# Patient Record
Sex: Female | Born: 1970
Health system: Southern US, Community
[De-identification: ages and names within clinical notes are randomized; demographics above are authoritative.]

## PROBLEM LIST (undated history)

## (undated) DIAGNOSIS — E119 Type 2 diabetes mellitus without complications: Secondary | ICD-10-CM

## (undated) DIAGNOSIS — Z91018 Allergy to other foods: Secondary | ICD-10-CM

## (undated) HISTORY — DX: Type 2 diabetes mellitus without complications: E11.9

## (undated) HISTORY — PX: MENISCUS REPAIR: SHX5179

---

## 2004-01-31 ENCOUNTER — Emergency Department (HOSPITAL_COMMUNITY): Admission: EM | Admit: 2004-01-31 | Discharge: 2004-01-31 | Payer: Self-pay | Admitting: Emergency Medicine

## 2015-07-13 ENCOUNTER — Emergency Department (HOSPITAL_BASED_OUTPATIENT_CLINIC_OR_DEPARTMENT_OTHER)
Admission: EM | Admit: 2015-07-13 | Discharge: 2015-07-13 | Disposition: A | Discharge status: Home / Self Care | Payer: 59 | Attending: Emergency Medicine | Admitting: Emergency Medicine

## 2015-07-13 ENCOUNTER — Emergency Department (HOSPITAL_BASED_OUTPATIENT_CLINIC_OR_DEPARTMENT_OTHER): Payer: 59

## 2015-07-13 ENCOUNTER — Encounter (HOSPITAL_BASED_OUTPATIENT_CLINIC_OR_DEPARTMENT_OTHER): Payer: Self-pay

## 2015-07-13 DIAGNOSIS — R079 Chest pain, unspecified: Secondary | ICD-10-CM | POA: Diagnosis not present

## 2015-07-13 DIAGNOSIS — R000 Tachycardia, unspecified: Secondary | ICD-10-CM | POA: Diagnosis not present

## 2015-07-13 HISTORY — DX: Allergy to other foods: Z91.018

## 2015-07-13 LAB — TROPONIN I
Troponin I: <0.03 ng/mL
Troponin I: <0.03 ng/mL

## 2015-07-13 LAB — BASIC METABOLIC PANEL WITH GFR
Anion gap: 11 (ref 5–15)
BUN: 10 mg/dL (ref 6–20)
CO2: 22 mmol/L (ref 22–32)
Calcium: 9.2 mg/dL (ref 8.9–10.3)
Chloride: 103 mmol/L (ref 101–111)
Creatinine, Ser: 0.55 mg/dL (ref 0.44–1.00)
GFR calc Af Amer: >60 mL/min
GFR calc non Af Amer: >60 mL/min
Glucose, Bld: 105 mg/dL — ABNORMAL HIGH (ref 65–99)
Potassium: 3.7 mmol/L (ref 3.5–5.1)
Sodium: 136 mmol/L (ref 135–145)

## 2015-07-13 LAB — CBC
HCT: 39 % (ref 36.0–46.0)
Hemoglobin: 13.3 g/dL (ref 12.0–15.0)
MCH: 30 pg (ref 26.0–34.0)
MCHC: 34.1 g/dL (ref 30.0–36.0)
MCV: 88 fL (ref 78.0–100.0)
Platelets: 329 K/uL (ref 150–400)
RBC: 4.43 MIL/uL (ref 3.87–5.11)
RDW: 12.8 % (ref 11.5–15.5)
WBC: 10.9 K/uL — ABNORMAL HIGH (ref 4.0–10.5)

## 2015-07-13 MED ORDER — TRAMADOL HCL 50 MG PO TABS: 50.0000 mg | ORAL_TABLET | Freq: Four times a day (QID) | ORAL | Status: DC | PRN

## 2015-07-13 MED ORDER — HYDROCODONE-ACETAMINOPHEN 5-325 MG PO TABS
1.0000 | ORAL_TABLET | Freq: Once | ORAL | Status: DC
  Filled 2015-07-13: qty 1

## 2015-07-13 MED ORDER — TRAMADOL HCL 50 MG PO TABS
50.0000 mg | ORAL_TABLET | Freq: Once | ORAL | Status: AC
  Administered 2015-07-13: 50 mg via ORAL
  Filled 2015-07-13: qty 1

## 2015-07-13 NOTE — ED Notes (Signed)
CP, left shoulder and upper back pain x 2 weeks-PCP sent pt to ED

## 2015-07-13 NOTE — Discharge Instructions (Signed)
Chest Pain Observation It is often hard to give a specific diagnosis for the cause of chest pain. Among other possibilities your symptoms might be caused by inadequate oxygen delivery to your heart (angina). Angina that is not treated or evaluated can lead to a heart attack (myocardial infarction) or death. Blood tests, electrocardiograms, and X-rays may have been done to help determine a possible cause of your chest pain. After evaluation and observation, your health care provider has determined that it is unlikely your pain was caused by an unstable condition that requires hospitalization. However, a full evaluation of your pain may need to be completed, with additional diagnostic testing as directed. It is very important to keep your follow-up appointments. Not keeping your follow-up appointments could result in permanent heart damage, disability, or death. If there is any problem keeping your follow-up appointments, you must call your health care provider. HOME CARE INSTRUCTIONS  Due to the slight chance that your pain could be angina, it is important to follow your health care provider's treatment plan and also maintain a healthy lifestyle:  Maintain or work toward achieving a healthy weight.  Stay physically active and exercise regularly.  Decrease your salt intake.  Eat a balanced, healthy diet. Talk to a dietitian to learn about heart-healthy foods.  Increase your fiber intake by including whole grains, vegetables, fruits, and nuts in your diet.  Avoid situations that cause stress, anger, or depression.  Take medicines as advised by your health care provider. Report any side effects to your health care provider. Do not stop medicines or adjust the dosages on your own.  Quit smoking. Do not use nicotine patches or gum until you check with your health care provider.  Keep your blood pressure, blood sugar, and cholesterol levels within normal limits.  Limit alcohol intake to no more than  1 drink per day for women who are not pregnant and 2 drinks per day for men.  Do not abuse drugs. SEEK IMMEDIATE MEDICAL CARE IF: You have severe chest pain or pressure which may include symptoms such as:  You feel pain or pressure in your arms, neck, jaw, or back.  You have severe back or abdominal pain, feel sick to your stomach (nauseous), or throw up (vomit).  You are sweating profusely.  You are having a fast or irregular heartbeat.  You feel short of breath while at rest.  You notice increasing shortness of breath during rest, sleep, or with activity.  You have chest pain that does not get better after rest or after taking your usual medicine.  You wake from sleep with chest pain.  You are unable to sleep because you cannot breathe.  You develop a frequent cough or you are coughing up blood.  You feel dizzy, faint, or experience extreme fatigue.  You develop severe weakness, dizziness, fainting, or chills. Any of these symptoms may represent a serious problem that is an emergency. Do not wait to see if the symptoms will go away. Call your local emergency services (911 in the [place] Do not drive yourself to the hospital. MAKE SURE YOU:  Understand these instructions.  Will watch your condition.  Will get help right away if you are not doing well or get worse. Document Released: 11/29/2010 Document Revised: 11/01/2013 Document Reviewed: 04/28/2013 [facility] Patient Information 2015 [place], [place]. This information is not intended to replace advice given to you by your health care provider. Make sure you discuss any questions you have with your health care provider.  Follow up with cardiology, recommend stress test to evaluate source of chest pain. Return if chest pain persists, left arm pain/weakness, shortness of breath occurs.

## 2015-07-13 NOTE — ED Provider Notes (Signed)
CSN: [id number]     Arrival date & time 07/13/15  1634 History   First MD Initiated Contact with Patient 07/13/15 1646     Chief Complaint  Patient presents with  . Chest Pain     (Consider location/radiation/quality/duration/timing/severity/associated sxs/prior Treatment) HPI Comments: Angela Carson is a 44 y.o F with no significant pmhx who presents to the Emergency Department c/o chest pain x 2 weeks. Pt states that she feels a "heaviness" in her left upper chest that radiates to her left should and left upper back. Pt has been experiencing this pain almost every day for 2 weeks most often in the late afternoon when she gets off work. No aggravating or alleviating factors. Pain 7/10. No trauma or injury. Pain does not radiate to her left arm, however, pt states that when she wakes up in the mornings her arm tingles sometimes. Endorses mild SOB in the afternoons.  Pt saw her PCP today who performed an EKG and then sent her to the ER for further evaluation. Pt does not have a history of heart dz and does not take any medications. Never a smoker and does not take exogenous estrogen. Denies DOE, HA, n/v, syncope, lightheadedness, dizziness, numbness, weakness.   Patient is a 44 y.o. female presenting with chest pain. The history is provided by the patient.  Chest Pain Associated symptoms: no diaphoresis, no fever and no palpitations     Past Medical History  Diagnosis Date  . Multiple food allergies    History reviewed. No pertinent past surgical history. No family history on file. Social History  Substance Use Topics  . Smoking status: Never Smoker   . Smokeless tobacco: None  . Alcohol Use: No   OB History    No data available     Review of Systems  Constitutional: Negative for fever, chills and diaphoresis.  Cardiovascular: Positive for chest pain. Negative for palpitations and leg swelling.  Musculoskeletal: Negative for neck pain and neck stiffness.  All other systems  reviewed and are negative.     Allergies  Review of patient's allergies indicates no known allergies.  Home Medications   Prior to Admission medications   Not on File   BP 157/83 mmHg  Pulse 107  Temp(Src) 98.5 F (36.9 C) (Oral)  Resp 15  Ht  (1.549 m)  Wt 159 lb (72.122 kg)  BMI 30.06 kg/m2  SpO2 99%  LMP 06/26/2015 Physical Exam  Constitutional: She is oriented to person, place, and time. She appears well-developed and well-nourished. No distress.  HENT:  Head: Normocephalic and atraumatic.  Mouth/Throat: Oropharynx is clear and moist. No oropharyngeal exudate.  Eyes: Conjunctivae and EOM are normal. Pupils are equal, round, and reactive to light. Right eye exhibits no discharge. Left eye exhibits no discharge. No scleral icterus.  Neck: No JVD present.  Cardiovascular: Regular rhythm, normal heart sounds and intact distal pulses.  Exam reveals no gallop and no friction rub.   No murmur heard. Tachycardic 107 bpm  Pulmonary/Chest: Effort normal and breath sounds normal. No respiratory distress. She has no wheezes. She has no rales. She exhibits tenderness.  Abdominal: Soft. She exhibits no distension. There is no tenderness. There is no rebound and no guarding.  Musculoskeletal: Normal range of motion. She exhibits no edema.  No TTP of left shoulder. No midline tenderness of spine.  Lymphadenopathy:    She has no cervical adenopathy.  Neurological: She is alert and oriented to person, place, and time.  Strength 5/5  throughout. No sensory deficits.    Skin: Skin is warm and dry. No rash noted. She is not diaphoretic. No erythema. No pallor.  Psychiatric: She has a normal mood and affect. Her behavior is normal.  Nursing note and vitals reviewed.   ED Course  Procedures (including critical care time)  CBC, BMP, CXR, troponin ordered Will order 2 troponin  Labs Review Labs Reviewed  BASIC METABOLIC PANEL - Abnormal; Notable for the following:    Glucose,  Bld 105 (*)    All other components within normal limits  CBC - Abnormal; Notable for the following:    WBC 10.9 (*)    All other components within normal limits  TROPONIN I  TROPONIN I    Imaging Review No results found. I have personally reviewed and evaluated these images and lab results as part of my medical decision-making.   EKG Interpretation   Date/Time:  Friday July 13 2015 16:42:34 EDT Ventricular Rate:  98 PR Interval:  166 QRS Duration: 78 QT Interval:  358 QTC Calculation: 457 R Axis:   37 Text Interpretation:  Normal sinus rhythm Normal ECG Similar to prior  Confirmed by Gwendolyn Grant  MD, BLAIR ([id number]) on 07/13/2015 4:43:18 PM      MDM   Final diagnoses:  Chest pain, unspecified chest pain type   Pt seen for chest pain x 2 weeks, sent to the ER by PCP. EKG unremarkable. MI or arrhythmia unlikely. Troponin x2 negative. CXR negative. PERC negative. Recommend follow up with cardiology for follow up stress test. HEART score of 1: low risk for major cardiac event.  Patient was discussed with and seen by Dr. Gwendolyn Grant who agrees with the treatment plan.      Lester Kinsman [place], PA-C 07/13/15 2111  Dub Mikes, PA-C 07/13/15 2113  Elwin Mocha, MD 07/13/15 [phone]

## 2017-08-12 ENCOUNTER — Ambulatory Visit (INDEPENDENT_AMBULATORY_CARE_PROVIDER_SITE_OTHER): Payer: 59 | Admitting: Podiatry

## 2017-08-12 ENCOUNTER — Encounter: Payer: Self-pay | Admitting: Podiatry

## 2017-08-12 VITALS — BP 143/78 | HR 90 | Ht 61.0 in | Wt 150.0 lb

## 2017-08-12 DIAGNOSIS — M722 Plantar fascial fibromatosis: Secondary | ICD-10-CM | POA: Diagnosis not present

## 2017-08-12 DIAGNOSIS — M79672 Pain in left foot: Secondary | ICD-10-CM | POA: Diagnosis not present

## 2017-08-12 DIAGNOSIS — M216X1 Other acquired deformities of right foot: Secondary | ICD-10-CM

## 2017-08-12 DIAGNOSIS — M21969 Unspecified acquired deformity of unspecified lower leg: Secondary | ICD-10-CM | POA: Diagnosis not present

## 2017-08-12 DIAGNOSIS — M79671 Pain in right foot: Secondary | ICD-10-CM | POA: Diagnosis not present

## 2017-08-12 DIAGNOSIS — M216X2 Other acquired deformities of left foot: Secondary | ICD-10-CM

## 2017-08-12 MED ORDER — NABUMETONE 500 MG PO TABS: 500.0000 mg | ORAL_TABLET | Freq: Two times a day (BID) | ORAL | 1 refills | Status: DC

## 2017-08-12 NOTE — Progress Notes (Signed)
SUBJECTIVE: 46 y.o. year old female presents complaining of pain in both feet and leg for over a year. Hurts at heel, arch and ball, all over. Hurt the most in the afternoon, in the morning when getting out of bed. On feet at work about 8 hours working in [employer]. Patient referred herself.  REVIEW OF SYSTEMS: Constitutional: negative for chills, fevers, night sweats and weight loss Ears, nose, mouth, throat, and face: negative Respiratory: negative Cardiovascular: negative Gastrointestinal: negative Musculoskeletal:negative, Been having back and hip pain for a month or two. Neurological: Feels numbness and tingling on feet bottom and back of heel. Endocrine: negative Allergic/Immunologic: negative  OBJECTIVE: DERMATOLOGIC EXAMINATION: No abnormal skin lesions noted.  VASCULAR EXAMINATION OF LOWER LIMBS: All pedal pulses are palpable with normal pulsation.  No edema or erythema noted bilateral. Temperature gradient from tibial crest to dorsum of foot is within normal bilateral.  NEUROLOGIC EXAMINATION OF THE LOWER LIMBS: Achilles DTR is present and within normal. Monofilament (Semmes-Weinstein 10-gm) sensory testing positive 6 out of 6, bilateral. Vibratory sensations(128Hz  turning fork) intact at medial and lateral forefoot bilateral.  Sharp and Dull discriminatory sensations at the plantar ball of hallux is intact bilateral.   MUSCULOSKELETAL EXAMINATION: High arched foot with excess first ray sagittal plane motion bilateral. Positive for excessive subtalar and ankle joint pronation bilateral. Pain at plantar heel and arches of both feet.  RADIOGRAPHIC STUDIES:  AP View:  Metatarsus adductus bilateral. Long first metatarsal bone bilateral. Minimum change in CCJ deviation. Lateral view:  High arched cavus foot with anterior break in CYMA line bilateral. Positive for plantar and posterior heel spur bilateral. Elevated first metatarsal bone  L>R.   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 Plantar fasciitis bilateral.  PLAN: Reviewed findings and available treatment options, ankle brace, orthotics, proper shoe gear, NSAIA, possible surgical option. Left foot and ankle placed in ankle brace with instruction. Relafen 500 mg bid prescribed. As per request surgery consent form reviewed for STJ Arthroereisis left foot. May also benefit for custom orthotics.

## 2017-08-12 NOTE — Patient Instructions (Signed)
Seen for bilateral foot pain.  Noted of excess subtalar joint pronation. Reviewed clinical findings and available treatment options. Ankle brace dispensed with instruction. Need to have custom orthotics. As per request surgery consent form reviewed for Subtalar joint Arthroereisis left foot.

## 2018-01-28 DIAGNOSIS — J069 Acute upper respiratory infection, unspecified: Secondary | ICD-10-CM | POA: Diagnosis not present

## 2018-01-28 DIAGNOSIS — R05 Cough: Secondary | ICD-10-CM | POA: Diagnosis not present

## 2018-08-06 DIAGNOSIS — M5441 Lumbago with sciatica, right side: Secondary | ICD-10-CM | POA: Diagnosis not present

## 2018-08-06 DIAGNOSIS — M5442 Lumbago with sciatica, left side: Secondary | ICD-10-CM | POA: Diagnosis not present

## 2018-08-06 DIAGNOSIS — R293 Abnormal posture: Secondary | ICD-10-CM | POA: Diagnosis not present

## 2018-09-21 NOTE — Progress Notes (Addendum)
[facility] [address] [place], [place] [address] [phone]  Date:  09/23/2018   Name:  Angela Carson   DOB:  1971-09-07   MRN:  [id number]  PCP:  Pearline Cables, MD    Chief Complaint: New Patient (Initial Visit)   History of Present Illness:  Angela Carson is a 47 y.o. 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   He is Tour manager also at [employer]  She does have some foot trouble and sees podiatry  She has to stand a lot at work- however this requirment had eased so her feet are doing better  She is a never smoker She has generally been in good health No operations   In her free time she enjoys cooking, relaxing at home  For exercise she likes to walk when she can  She did have a mammogram several years ago No recent pap either - never had an abnl   We decided to do a pap and CPE today Offered Tdap but she wishes to do later   She is using condoms right now for contraception She had been on depo but stopped using this a few years ago Her menses are still regular  No history of stroke,no history of HTN She does not get migraine or aura No history of cancer We were not easily able to find a contraceptive that she wishes to use, so asked her to come back soon to discuss options in further detail   Patient Active Problem List   Diagnosis Date Noted  . Foot pain, bilateral 08/12/2017    Past Medical History:  Diagnosis Date  . Multiple food allergies     No past surgical history on file.  Social History   Tobacco Use  . Smoking status: Never Smoker  . Smokeless tobacco:  Never Used  Substance Use Topics  . Alcohol use: No  . Drug use: No    No family history on file.  No Known Allergies  Medication list has been reviewed and updated.  Current Outpatient Medications on File Prior to Visit  Medication Sig Dispense Refill  . fexofenadine (ALLEGRA) 180 MG tablet Take by mouth.    Marland Kitchen ibuprofen (ADVIL,MOTRIN) 100 MG tablet Take 100 mg by mouth every 6 (six) hours as needed for fever.    . Melatonin 1 MG CAPS Take by mouth.    . nabumetone (RELAFEN) 500 MG tablet Take 1 tablet (500 mg total) by mouth 2 (two) times daily. 60 tablet 1   No current facility-administered medications on file prior to visit.     Review of Systems:  As per HPI- otherwise negative.   Physical Examination: Vitals:   09/23/18 1023  BP: 132/88  Pulse: 97  Resp: 16  Temp: 97.7 F (36.5 C)  SpO2: 98%   Vitals:   09/23/18 1023  Weight: 157 lb (71.2 kg)  Height: 5\' 1"  (1.549 m)   Body mass index is 29.66 kg/m. Ideal Body Weight: Weight in (lb) to have BMI = 25: 132  GEN:  WDWN, NAD, Non-toxic, A & O x 3, overweight, looks well  HEENT: Atraumatic, Normocephalic. Neck supple. No masses, No LAD. Bilateral TM wnl, oropharynx normal.  PEERL,EOMI.   Ears and Nose: No external deformity. CV: RRR, No M/G/R. No JVD. No thrill. No extra heart sounds. PULM: CTA B, no wheezes, crackles, rhonchi. No retractions. No resp. distress. No accessory muscle use. ABD: S, NT, ND. No rebound. No HSM. EXTR: No c/c/e NEURO Normal gait.  PSYCH: Normally interactive. Conversant. Not depressed or anxious appearing.  Calm demeanor.  Pelvic: normal, no vaginal lesions or discharge. Uterus normal, no CMT, no adnexal tendereness or masses 3 approx 5mm diameter firm, hyperpigmented nodules are noted on her legs  LMP 11/7   Assessment and Plan: Physical exam  Screening for cervical cancer - Plan: Cytology - PAP  Screening for breast cancer - Plan: MM 3D SCREEN BREAST BILATERAL  Screening  for deficiency anemia - Plan: CBC  Screening for diabetes mellitus - Plan: Comprehensive metabolic panel, Hemoglobin A1c  Screening for hyperlipidemia - Plan: Lipid panel  Multiple skin nodules - Plan: Ambulatory referral to Dermatology  Immunization due  CPE today- new patient Labs pending as above Ordered mammo Did pap Asked her to see me soon to discuss other concerns as we ran out of time today   Signed Abbe Amsterdam, MD  Received her labs 11/16  Results for orders placed or performed in visit on 09/23/18  CBC  Result Value Ref Range   WBC 6.2 4.0 - 10.5 K/uL   RBC 4.59 3.87 - 5.11 Mil/uL   Platelets 315.0 150.0 - 400.0 K/uL   Hemoglobin 13.6 12.0 - 15.0 g/dL   HCT 40.9 81.1 - 91.4 %   MCV 88.0 78.0 - 100.0 fl   MCHC 33.7 30.0 - 36.0 g/dL   RDW 78.2 95.6 - 21.3 %  Comprehensive metabolic panel  Result Value Ref Range   Sodium 138 135 - 145 mEq/L   Potassium 3.6 3.5 - 5.1 mEq/L   Chloride 103 96 - 112 mEq/L   CO2 25 19 - 32 mEq/L   Glucose, Bld 89 70 - 99 mg/dL   BUN 8 6 - 23 mg/dL   Creatinine, Ser 0.86 0.40 - 1.20 mg/dL   Total Bilirubin 0.5 0.2 - 1.2 mg/dL   Alkaline Phosphatase 62 39 - 117 U/L   AST 13 0 - 37 U/L   ALT 11 0 - 35 U/L   Total Protein 7.8 6.0 - 8.3 g/dL   Albumin 4.5 3.5 - 5.2 g/dL   Calcium 9.1 8.4 - 57.8 mg/dL   GFR 469.62 >95.28 mL/min  Hemoglobin A1c  Result Value Ref Range   Hgb A1c MFr Bld 6.7 (H) 4.6 - 6.5 %  Lipid panel  Result Value Ref Range   Cholesterol 232 (H) 0 - 200 mg/dL   Triglycerides 413.2 (H) 0.0 - 149.0 mg/dL   HDL 44.01 >02.72 mg/dL   VLDL 53.6 0.0 - 64.4 mg/dL   LDL Cholesterol 034 (H) 0 - 99 mg/dL   Total CHOL/HDL Ratio 4    NonHDL 169.93   Cytology - PAP  Result Value Ref Range   Adequacy      Satisfactory for evaluation  endocervical/transformation zone component PRESENT.   Diagnosis      NEGATIVE FOR INTRAEPITHELIAL LESIONS OR MALIGNANCY.   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  However, we don't diagnose diabetes unless we see a high A1c twice. Your cholesterol is a bit higher than I would like, but given your young age your risk of heart disease is still low.   Please come and see me again in the next fe

## 2018-09-23 ENCOUNTER — Encounter: Payer: Self-pay | Admitting: Family Medicine

## 2018-09-23 ENCOUNTER — Ambulatory Visit (INDEPENDENT_AMBULATORY_CARE_PROVIDER_SITE_OTHER): Payer: 59 | Admitting: Family Medicine

## 2018-09-23 ENCOUNTER — Other Ambulatory Visit (HOSPITAL_COMMUNITY)
Admission: RE | Admit: 2018-09-23 | Discharge: 2018-09-23 | Disposition: A | Discharge status: Home / Self Care | Payer: 59 | Source: Ambulatory Visit | Attending: Family Medicine | Admitting: Family Medicine

## 2018-09-23 ENCOUNTER — Ambulatory Visit (HOSPITAL_BASED_OUTPATIENT_CLINIC_OR_DEPARTMENT_OTHER)
Admission: RE | Admit: 2018-09-23 | Discharge: 2018-09-23 | Disposition: A | Discharge status: Home / Self Care | Payer: 59 | Source: Ambulatory Visit | Attending: Family Medicine | Admitting: Family Medicine

## 2018-09-23 VITALS — BP 132/88 | HR 97 | Temp 97.7°F | Resp 16 | Ht 61.0 in | Wt 157.0 lb

## 2018-09-23 DIAGNOSIS — Z0000 Encounter for general adult medical examination without abnormal findings: Secondary | ICD-10-CM | POA: Diagnosis not present

## 2018-09-23 DIAGNOSIS — Z130 Encounter for screening for diseases of the blood and blood-forming organs and certain disorders involving the immune mechanism: Secondary | ICD-10-CM

## 2018-09-23 DIAGNOSIS — Z1239 Encounter for other screening for malignant neoplasm of breast: Secondary | ICD-10-CM | POA: Diagnosis not present

## 2018-09-23 DIAGNOSIS — Z13220 Encounter for screening for lipoid disorders: Secondary | ICD-10-CM

## 2018-09-23 DIAGNOSIS — Z1231 Encounter for screening mammogram for malignant neoplasm of breast: Secondary | ICD-10-CM | POA: Diagnosis not present

## 2018-09-23 DIAGNOSIS — Z124 Encounter for screening for malignant neoplasm of cervix: Secondary | ICD-10-CM

## 2018-09-23 DIAGNOSIS — R229 Localized swelling, mass and lump, unspecified: Secondary | ICD-10-CM

## 2018-09-23 DIAGNOSIS — Z131 Encounter for screening for diabetes mellitus: Secondary | ICD-10-CM | POA: Diagnosis not present

## 2018-09-23 DIAGNOSIS — Z23 Encounter for immunization: Secondary | ICD-10-CM

## 2018-09-23 LAB — COMPREHENSIVE METABOLIC PANEL
ALBUMIN: 4.5 g/dL (ref 3.5–5.2)
ALT: 11 U/L (ref 0–35)
AST: 13 U/L (ref 0–37)
Alkaline Phosphatase: 62 U/L (ref 39–117)
BILIRUBIN TOTAL: 0.5 mg/dL (ref 0.2–1.2)
BUN: 8 mg/dL (ref 6–23)
CALCIUM: 9.1 mg/dL (ref 8.4–10.5)
CO2: 25 meq/L (ref 19–32)
Chloride: 103 mEq/L (ref 96–112)
Creatinine, Ser: 0.46 mg/dL (ref 0.40–1.20)
GFR: 154.26 mL/min (ref >60.00)
Glucose, Bld: 89 mg/dL (ref 70–99)
Potassium: 3.6 mEq/L (ref 3.5–5.1)
Sodium: 138 mEq/L (ref 135–145)
Total Protein: 7.8 g/dL (ref 6.0–8.3)

## 2018-09-23 LAB — CBC
HCT: 40.4 % (ref 36.0–46.0)
HEMOGLOBIN: 13.6 g/dL (ref 12.0–15.0)
MCHC: 33.7 g/dL (ref 30.0–36.0)
MCV: 88 fl (ref 78.0–100.0)
PLATELETS: 315 10*3/uL (ref 150.0–400.0)
RBC: 4.59 Mil/uL (ref 3.87–5.11)
RDW: 13.3 % (ref 11.5–15.5)
WBC: 6.2 10*3/uL (ref 4.0–10.5)

## 2018-09-23 LAB — LIPID PANEL
CHOLESTEROL: 232 mg/dL — AB (ref 0–200)
HDL: 61.8 mg/dL (ref >39.00)
LDL CALC: 137 mg/dL — AB (ref 0–99)
NonHDL: 169.93
TRIGLYCERIDES: 163 mg/dL — AB (ref 0.0–149.0)
Total CHOL/HDL Ratio: 4
VLDL: 32.6 mg/dL (ref 0.0–40.0)

## 2018-09-23 LAB — HEMOGLOBIN A1C: HEMOGLOBIN A1C: 6.7 % — AB (ref 4.6–6.5)

## 2018-09-23 NOTE — Patient Instructions (Signed)
Good to see you today- I will be in touch with your labs and pap asap We will also refer you to see dermatology about the skin nodules on your legs Please stop by the imaging dept on the ground floor after your labs today for your mammogram!   Health Maintenance, Female Adopting a healthy lifestyle and getting preventive care can go a long way to promote health and wellness. Talk with your health care provider about what schedule of regular examinations is right for you. This is a good chance for you to check in with your provider about disease prevention and staying healthy. In between checkups, there are plenty of things you can do on your own. Experts have done a lot of research about which lifestyle changes and preventive measures are most likely to keep you healthy. Ask your health care provider for more information. Weight and diet Eat a healthy diet  Be sure to include plenty of vegetables, fruits, low-fat dairy products, and lean protein.  Do not eat a lot of foods high in solid fats, added sugars, or salt.  Get regular exercise. This is one of the most important things you can do for your health. ? Most adults should exercise for at least 150 minutes each week. The exercise should increase your heart rate and make you sweat (moderate-intensity exercise). ? Most adults should also do strengthening exercises at least twice a week. This is in addition to the moderate-intensity exercise.  Maintain a healthy weight  Body mass index (BMI) is a measurement that can be used to identify possible weight problems. It estimates body fat based on height and weight. Your health care provider can help determine your BMI and help you achieve or maintain a healthy weight.  For females 70 years of age and older: ? A BMI below 18.5 is considered underweight. ? A BMI of 18.5 to 24.9 is normal. ? A BMI of 25 to 29.9 is considered overweight. ? A BMI of 30 and above is considered obese.  Watch levels  of cholesterol and blood lipids  You should start having your blood tested for lipids and cholesterol at 47 years of age, then have this test every 5 years.  You may need to have your cholesterol levels checked more often if: ? Your lipid or cholesterol levels are high. ? You are older than 47 years of age. ? You are at high risk for heart disease.  Cancer screening Lung Cancer  Lung cancer screening is recommended for adults 10-54 years old who are at high risk for lung cancer because of a history of smoking.  A yearly low-dose CT scan of the lungs is recommended for people who: ? Currently smoke. ? Have quit within the past 15 years. ? Have at least a 30-pack-year history of smoking. A pack year is smoking an average of one pack of cigarettes a day for 1 year.  Yearly screening should continue until it has been 15 years since you quit.  Yearly screening should stop if you develop a health problem that would prevent you from having lung cancer treatment.  Breast Cancer  Practice breast self-awareness. This means understanding how your breasts normally appear and feel.  It also means doing regular breast self-exams. Let your health care provider know about any changes, no matter how small.  If you are in your 20s or 30s, you should have a clinical breast exam (CBE) by a health care provider every 1-3 years as part of a  regular health exam.  If you are 40 or older, have a CBE every year. Also consider having a breast X-ray (mammogram) every year.  If you have a family history of breast cancer, talk to your health care provider about genetic screening.  If you are at high risk for breast cancer, talk to your health care provider about having an MRI and a mammogram every year.  Breast cancer gene (BRCA) assessment is recommended for women who have family members with BRCA-related cancers. BRCA-related cancers include: ? Breast. ? Ovarian. ? Tubal. ? Peritoneal  cancers.  Results of the assessment will determine the need for genetic counseling and BRCA1 and BRCA2 testing.  Cervical Cancer Your health care provider may recommend that you be screened regularly for cancer of the pelvic organs (ovaries, uterus, and vagina). This screening involves a pelvic examination, including checking for microscopic changes to the surface of your cervix (Pap test). You may be encouraged to have this screening done every 3 years, beginning at age 86.  For women ages 51-65, health care providers may recommend pelvic exams and Pap testing every 3 years, or they may recommend the Pap and pelvic exam, combined with testing for human papilloma virus (HPV), every 5 years. Some types of HPV increase your risk of cervical cancer. Testing for HPV may also be done on women of any age with unclear Pap test results.  Other health care providers may not recommend any screening for nonpregnant women who are considered low risk for pelvic cancer and who do not have symptoms. Ask your health care provider if a screening pelvic exam is right for you.  If you have had past treatment for cervical cancer or a condition that could lead to cancer, you need Pap tests and screening for cancer for at least 20 years after your treatment. If Pap tests have been discontinued, your risk factors (such as having a new sexual partner) need to be reassessed to determine if screening should resume. Some women have medical problems that increase the chance of getting cervical cancer. In these cases, your health care provider may recommend more frequent screening and Pap tests.  Colorectal Cancer  This type of cancer can be detected and often prevented.  Routine colorectal cancer screening usually begins at 47 years of age and continues through 47 years of age.  Your health care provider may recommend screening at an earlier age if you have risk factors for colon cancer.  Your health care provider may also  recommend using home test kits to check for hidden blood in the stool.  A small camera at the end of a tube can be used to examine your colon directly (sigmoidoscopy or colonoscopy). This is done to check for the earliest forms of colorectal cancer.  Routine screening usually begins at age 32.  Direct examination of the colon should be repeated every 5-10 years through 47 years of age. However, you may need to be screened more often if early forms of precancerous polyps or small growths are found.  Skin Cancer  Check your skin from head to toe regularly.  Tell your health care provider about any new moles or changes in moles, especially if there is a change in a mole's shape or color.  Also tell your health care provider if you have a mole that is larger than the size of a pencil eraser.  Always use sunscreen. Apply sunscreen liberally and repeatedly throughout the day.  Protect yourself by wearing long sleeves, pants,  a wide-brimmed hat, and sunglasses whenever you are outside.  Heart disease, diabetes, and high blood pressure  High blood pressure causes heart disease and increases the risk of stroke. High blood pressure is more likely to develop in: ? People who have blood pressure in the high end of the normal range (130-139/85-89 mm Hg). ? People who are overweight or obese. ? People who are African American.  If you are 28-66 years of age, have your blood pressure checked every 3-5 years. If you are 56 years of age or older, have your blood pressure checked every year. You should have your blood pressure measured twice-once when you are at a hospital or clinic, and once when you are not at a hospital or clinic. Record the average of the two measurements. To check your blood pressure when you are not at a hospital or clinic, you can use: ? An automated blood pressure machine at a pharmacy. ? A home blood pressure monitor.  If you are between 50 years and 73 years old, ask your  health care provider if you should take aspirin to prevent strokes.  Have regular diabetes screenings. This involves taking a blood sample to check your fasting blood sugar level. ? If you are at a normal weight and have a low risk for diabetes, have this test once every three years after 47 years of age. ? If you are overweight and have a high risk for diabetes, consider being tested at a younger age or more often. Preventing infection Hepatitis B  If you have a higher risk for hepatitis B, you should be screened for this virus. You are considered at high risk for hepatitis B if: ? You were born in a country where hepatitis B is common. Ask your health care provider which countries are considered high risk. ? Your parents were born in a high-risk country, and you have not been immunized against hepatitis B (hepatitis B vaccine). ? You have HIV or AIDS. ? You use needles to inject street drugs. ? You live with someone who has hepatitis B. ? You have had sex with someone who has hepatitis B. ? You get hemodialysis treatment. ? You take certain medicines for conditions, including cancer, organ transplantation, and autoimmune conditions.  Hepatitis C  Blood testing is recommended for: ? Everyone born from [birth year] through 1965. ? Anyone with known risk factors for hepatitis C.  Sexually transmitted infections (STIs)  You should be screened for sexually transmitted infections (STIs) including gonorrhea and chlamydia if: ? You are sexually active and are younger than 47 years of age. ? You are older than 47 years of age and your health care provider tells you that you are at risk for this type of infection. ? Your sexual activity has changed since you were last screened and you are at an increased risk for chlamydia or gonorrhea. Ask your health care provider if you are at risk.  If you do not have HIV, but are at risk, it may be recommended that you take a prescription medicine daily to  prevent HIV infection. This is called pre-exposure prophylaxis (PrEP). You are considered at risk if: ? You are sexually active and do not regularly use condoms or know the HIV status of your partner(s). ? You take drugs by injection. ? You are sexually active with a partner who has HIV.  Talk with your health care provider about whether you are at high risk of being infected with HIV. If you choose  to begin PrEP, you should first be tested for HIV. You should then be tested every 3 months for as long as you are taking PrEP. Pregnancy  If you are premenopausal and you may become pregnant, ask your health care provider about preconception counseling.  If you may become pregnant, take 400 to 800 micrograms (mcg) of folic acid every day.  If you want to prevent pregnancy, talk to your health care provider about birth control (contraception). Osteoporosis and menopause  Osteoporosis is a disease in which the bones lose minerals and strength with aging. This can result in serious bone fractures. Your risk for osteoporosis can be identified using a bone density scan.  If you are 76 years of age or older, or if you are at risk for osteoporosis and fractures, ask your health care provider if you should be screened.  Ask your health care provider whether you should take a calcium or vitamin D supplement to lower your risk for osteoporosis.  Menopause may have certain physical symptoms and risks.  Hormone replacement therapy may reduce some of these symptoms and risks. Talk to your health care provider about whether hormone replacement therapy is right for you. Follow these instructions at home:  Schedule regular health, dental, and eye exams.  Stay current with your immunizations.  Do not use any tobacco products including cigarettes, chewing tobacco, or electronic cigarettes.  If you are pregnant, do not drink alcohol.  If you are breastfeeding, limit how much and how often you drink  alcohol.  Limit alcohol intake to no more than 1 drink per day for nonpregnant women. One drink equals 12 ounces of beer, 5 ounces of wine, or 1 ounces of hard liquor.  Do not use street drugs.  Do not share needles.  Ask your health care provider for help if you need support or information about quitting drugs.  Tell your health care provider if you often feel depressed.  Tell your health care provider if you have ever been abused or do not feel safe at home. This information is not intended to replace advice given to you by your health care provider. Make sure you discuss any questions you have with your health care provider. Document Released: 05/12/2011 Document Revised: 04/03/2016 Document Reviewed: 07/31/2015 Elsevier Interactive Patient Education  [employer].

## 2018-09-24 LAB — CYTOLOGY - PAP
Diagnosis: NEGATIVE
HPV: NOT DETECTED

## 2019-06-20 ENCOUNTER — Encounter: Payer: Self-pay | Admitting: Medical

## 2019-06-20 ENCOUNTER — Other Ambulatory Visit: Payer: Self-pay

## 2019-06-20 ENCOUNTER — Ambulatory Visit: Payer: Self-pay | Admitting: *Deleted

## 2019-06-20 ENCOUNTER — Ambulatory Visit (INDEPENDENT_AMBULATORY_CARE_PROVIDER_SITE_OTHER): Payer: 59 | Admitting: Medical

## 2019-06-20 ENCOUNTER — Ambulatory Visit (HOSPITAL_BASED_OUTPATIENT_CLINIC_OR_DEPARTMENT_OTHER)
Admission: RE | Admit: 2019-06-20 | Discharge: 2019-06-20 | Disposition: A | Discharge status: Home / Self Care | Payer: 59 | Source: Ambulatory Visit | Attending: Medical | Admitting: Medical

## 2019-06-20 VITALS — BP 143/82 | HR 96 | Temp 98.5°F | Resp 16 | Ht 61.0 in | Wt 161.4 lb

## 2019-06-20 DIAGNOSIS — M79662 Pain in left lower leg: Secondary | ICD-10-CM | POA: Insufficient documentation

## 2019-06-20 MED ORDER — MELOXICAM 7.5 MG PO TABS: 7.5000 mg | ORAL_TABLET | Freq: Every day | ORAL | 0 refills | Status: DC

## 2019-06-20 NOTE — Progress Notes (Signed)
Subjective:    Patient ID: Angela Carson, female    DOB: 09/27/1971, 48 y.o.   MRN: [id number]  HPI  Pt in for left calf swelling for about 2 weeks. Pt state she has standing and sitting for hours sewing mask a whole day before. Rested leg and still has pain. Pt speculates maybe sob but not sure since wearing a mask.  No thigh or groin pain reported.  No trauma or recent new exercise. Non smoker.  LMP- Every other month pattern for 6 months. June 20 th. Pt getting hot flashes already.  Discomfort about level 7/10.   Review of Systems  Constitutional: Negative for chills and fever.  Respiratory: Negative for cough, chest tightness, shortness of breath and wheezing.   Cardiovascular: Negative for chest pain and palpitations.  Gastrointestinal: Negative for abdominal pain.  Musculoskeletal: Negative for back pain and gait problem.  Skin: Negative for rash.  Neurological: Negative for dizziness, seizures, speech difficulty, weakness, light-headedness and numbness.  Hematological: Negative for adenopathy. Does not bruise/bleed easily.  Psychiatric/Behavioral: Negative for behavioral problems, confusion, self-injury and suicidal ideas. The patient is not nervous/anxious.     Past Medical History:  Diagnosis Date  . Multiple food allergies      Social History   Socioeconomic History  . Marital status: Married    Spouse name: Not on file  . Number of children: Not on file  . Years of education: Not on file  . Highest education level: Not on file  Occupational History  . Not on file  Social Needs  . Financial resource strain: Not on file  . Food insecurity    Worry: Not on file    Inability: Not on file  . Transportation needs    Medical: Not on file    Non-medical: Not on file  Tobacco Use  . Smoking status: Never Smoker  . Smokeless tobacco: Never Used  Substance and Sexual Activity  . Alcohol use: No  . Drug use: No  . Sexual activity: Yes    Birth  control/protection: None  Lifestyle  . Physical activity    Days per week: Not on file    Minutes per session: Not on file  . Stress: Not on file  Relationships  . Social Musicianconnections    Talks on phone: Not on file    Gets together: Not on file    Attends religious service: Not on file    Active member of club or organization: Not on file    Attends meetings of clubs or organizations: Not on file    Relationship status: Not on file  . Intimate partner violence    Fear of current or ex partner: Not on file    Emotionally abused: Not on file    Physically abused: Not on file    Forced sexual activity: Not on file  Other Topics Concern  . Not on file  Social History Narrative  . Not on file    No past surgical history on file.  No family history on file.  No Known Allergies  Current Outpatient Medications on File Prior to Visit  Medication Sig Dispense Refill  . fexofenadine (ALLEGRA) 180 MG tablet Take by mouth.    Marland Kitchen. ibuprofen (ADVIL,MOTRIN) 100 MG tablet Take 100 mg by mouth every 6 (six) hours as needed for fever.    . Melatonin 1 MG CAPS Take by mouth.    . nabumetone (RELAFEN) 500 MG tablet Take 1 tablet (500 mg total)  by mouth 2 (two) times daily. 60 tablet 1   No current facility-administered medications on file prior to visit.     BP (!) 143/82   Pulse 96   Temp 98.5 F (36.9 C) (Oral)   Resp 16   Ht 5\' 1"  (1.549 m)   Wt 161 lb 6.4 oz (73.2 kg)   SpO2 99%   BMI 30.50 kg/m       Objective:   Physical Exam  General Mental Status- Alert. General Appearance- Not in acute distress.   Skin General: Color- Normal Color. Moisture- Normal Moisture.  Neck Carotid Arteries- Normal color. Moisture- Normal Moisture. No carotid bruits. No JVD.  Chest and Lung Exam Auscultation: Breath Sounds:-Normal.  Cardiovascular Auscultation:Rythm- Regular. Murmurs & Other Heart Sounds:Auscultation of the heart reveals- No Murmurs.  Abdomen Inspection:-Inspeection  Normal. Palpation/Percussion:Note:No mass. Palpation and Percussion of the abdomen reveal- Non Tender, Non Distended + BS, no rebound or guarding.    Neurologic Cranial Nerve exam:- CN III-XII intact(No nystagmus), symmetric smile. Strength:- 5/5 equal and symmetric strength both upper and lower extremities.   Left lower ext- faint calf  mid swelling. No varicosities. Normal pulses. Good capillary refill.     Assessment & Plan:  [phone].(tried to call but no answer)   For left lower minimal swelling and moderate pain described, will get left lower extremity ultrasound today stat.  Patient gave me her cell phone number and will call her with results when those are back.  We will follow result and if no DVT then likely prescribe meloxicam.  If they persist then might refer to orthopedist.  If no findings on ultrasound then advised conservative measures such as Ace wrap and rest.   [place] later came back showing no DVT. Will ask pt to follow  Friday morning after trying ace wrap compression mid calf and meloxicam.   If pain not improved then consider referral. If swelling or further up leg then consider imaging studies to evaluate possible iliac area as radiologist did mention further imaging on report. See impression note on [place].  Not pt o2 sat 99%. Her reported possible short of likely anxiety related. She states nervous just to be here.  Mackie Pai, PA-C

## 2019-06-20 NOTE — Patient Instructions (Addendum)
For left lower minimal swelling and moderate pain described, will get left lower extremity ultrasound today stat.  Patient gave me her cell phone number and will call her with results when those are back.  We will follow result and if no DVT then likely prescribe meloxicam.  If they persist then might refer to orthopedist.  If no findings on ultrasound then advised conservative measures such as Ace wrap and rest.  [place] later came back showing no DVT. Will ask pt to follow  Friday morning after trying ace wrap compression mid calf and meloxicam.   If pain not improved then consider referral. If swelling or further up leg then consider imaging studies to evaluate possible iliac area as radiologist did mention further imaging on report. See impression note on [place].

## 2019-06-20 NOTE — Telephone Encounter (Signed)
bilaterally below the knee edema with left greater than the right. Stated pain in the left calf is constant. Edema and pain in the left leg has increased recently. Denies CP/SOB. No redness/warmth to legs/left calf. Denies cool /blue foot. Advil helps somewhat with the painWarm transferred for immediate appointment. Reason for Disposition . [1] Thigh, calf, or ankle swelling AND [2] bilateral AND [3] 1 side is more swollen  Answer Assessment - Initial Assessment Questions 1. ONSET: "When did the swelling start?" (e.g., minutes, hours, days)     2-3 weeks ago first noticed 2. LOCATION: "What part of the leg is swollen?"  "Are both legs swollen or just one leg?"     Both legs, left greater than right 3. SEVERITY: "How bad is the swelling?" (e.g., localized; mild, moderate, severe)  - Localized - small area of swelling localized to one leg  - MILD pedal edema - swelling limited to foot and ankle, pitting edema < 1/4 inch (6 mm) deep, rest and elevation eliminate most or all swelling  - MODERATE edema - swelling of lower leg to knee, pitting edema > 1/4 inch (6 mm) deep, rest and elevation only partially reduce swelling  - SEVERE edema - swelling extends above knee, facial or hand swelling present      moderate 4. REDNESS: "Does the swelling look red or infected?"     no 5. PAIN: "Is the swelling painful to touch?" If so, ask: "How painful is it?"   (Scale 1-10; mild, moderate or severe)     Not really 6. FEVER: "Do you have a fever?" If so, ask: "What is it, how was it measured, and when did it start?"      no 7. CAUSE: "What do you think is causing the leg swelling?"     Stands alot 8. MEDICAL HISTORY: "Do you have a history of heart failure, kidney disease, liver failure, or cancer?"     no 9. RECURRENT SYMPTOM: "Have you had leg swelling before?" If so, ask: "When was the last time?" "What happened that time?"    Years ago when she stood on her feet all day 10. OTHER SYMPTOMS: "Do you have  any other symptoms?" (e.g., chest pain, difficulty breathing)       Shortness of breath with activity 11. PREGNANCY: "Is there any chance you are pregnant?" "When was your last menstrual period?"       no  Protocols used: LEG SWELLING AND EDEMA-A-AH

## 2019-06-20 NOTE — Telephone Encounter (Signed)
Appt scheduled

## 2019-06-23 ENCOUNTER — Telehealth: Payer: Self-pay | Admitting: Family Medicine

## 2019-06-23 ENCOUNTER — Encounter: Payer: Self-pay | Admitting: Family Medicine

## 2019-06-23 MED ORDER — DICLOFENAC SODIUM 75 MG PO TBEC: 75.0000 mg | DELAYED_RELEASE_TABLET | Freq: Two times a day (BID) | ORAL | 0 refills | Status: DC

## 2019-06-23 NOTE — Telephone Encounter (Signed)
Pt states she believes the meloxicam (MOBIC) 7.5 MG tablet is giving her stomach cramps, and she does not like it. Pt requesting to change to diclofenac 75 mg.  Pt states Percell Miller was the dr who prescribed.  [id number] [address], [place] [phone] (Phone) [phone] (Fax)

## 2019-06-23 NOTE — Telephone Encounter (Signed)
Please advise 

## 2019-06-23 NOTE — Telephone Encounter (Signed)
ok 

## 2019-06-27 ENCOUNTER — Other Ambulatory Visit: Payer: Self-pay

## 2019-06-27 ENCOUNTER — Ambulatory Visit (INDEPENDENT_AMBULATORY_CARE_PROVIDER_SITE_OTHER): Payer: 59 | Admitting: Medical

## 2019-06-27 DIAGNOSIS — R109 Unspecified abdominal pain: Secondary | ICD-10-CM | POA: Diagnosis not present

## 2019-06-27 DIAGNOSIS — M79669 Pain in unspecified lower leg: Secondary | ICD-10-CM | POA: Diagnosis not present

## 2019-06-27 NOTE — Progress Notes (Signed)
Subjective:    Patient ID: Angela Carson, female    DOB: 08/17/1971, 48 y.o.   MRN: [id number]  HPI  Virtual Visit via Telephone Note  I connected with Angela Carson on 06/27/19 at 11:20 AM EDT by telephone and verified that I am speaking with the correct person using two identifiers.  Location: Patient: home Provider: office   I discussed the limitations, risks, security and privacy concerns of performing an evaluation and management service by telephone and the availability of in person appointments. I also discussed with the patient that there may be a patient responsible charge related to this service. The patient expressed understanding and agreed to proceed.   History of Present Illness:   Pt states new complaint of some abdomen pain. Pt was taking meloxicam  for some mild calf discomfort. Her ultrasound of calf was negative. She states vague discomfort feel better. Some relief with massaging. Calf is no longer swollen. No popliteal pain. No sob or wheezing.  Pt states has not tried diclofenac which Dr. Patsy Lageropland.  Pt states will try ace wrap.  Pt not reporting and dyspnea.    Observations/Objective:  General- no acute distress, pleasant, oriented. Normal speech.   Assessment and Plan: Your previous calf discomfort has improved a lot as well as with decreased swelling.  The ultrasound of lower extremity did not reveal DVT.  Would recommend that you use Ace wrap to calf area, elevate and use diclofenac.  Discontinue meloxicam and let us know if you have same abdomen discomfort with diclofenac.  Please update me by this coming Friday you have any residual lower extremity symptoms.  If so then we would refer you to sports medicine.  Follow-up as regular scheduled with your PCP or with me as needed.  Esperanza RichtersEdward Lynnzie Blackson, PA-C  Follow Up Instructions:    I discussed the assessment and treatment plan with the patient. The patient was provided an opportunity to ask  questions and all were answered. The patient agreed with the plan and demonstrated an understanding of the instructions.   The patient was advised to call back or seek an in-person evaluation if the symptoms worsen or if the condition fails to improve as anticipated.  I provided 15 minutes of non-face-to-face time during this encounter.   Esperanza RichtersEdward Mylah Baynes, PA-C    Review of Systems  Constitutional: Negative for chills, fatigue and fever.  Respiratory: Negative for cough, chest tightness, shortness of breath and wheezing.   Cardiovascular: Negative for chest pain and palpitations.  Musculoskeletal:       See hpi.  Neurological: Negative for dizziness and light-headedness.  Hematological: Negative for adenopathy. Does not bruise/bleed easily.  Psychiatric/Behavioral: Negative for behavioral problems and confusion.   Past Medical History:  Diagnosis Date  . Multiple food allergies      Social History   Socioeconomic History  . Marital status: Married    Spouse name: Not on file  . Number of children: Not on file  . Years of education: Not on file  . Highest education level: Not on file  Occupational History  . Not on file  Social Needs  . Financial resource strain: Not on file  . Food insecurity    Worry: Not on file    Inability: Not on file  . Transportation needs    Medical: Not on file    Non-medical: Not on file  Tobacco Use  . Smoking status: Never Smoker  . Smokeless tobacco: Never Used  Substance and Sexual  Activity  . Alcohol use: No  . Drug use: No  . Sexual activity: Yes    Birth control/protection: None  Lifestyle  . Physical activity    Days per week: Not on file    Minutes per session: Not on file  . Stress: Not on file  Relationships  . Social Herbalist on phone: Not on file    Gets together: Not on file    Attends religious service: Not on file    Active member of club or organization: Not on file    Attends meetings of clubs or  organizations: Not on file    Relationship status: Not on file  . Intimate partner violence    Fear of current or ex partner: Not on file    Emotionally abused: Not on file    Physically abused: Not on file    Forced sexual activity: Not on file  Other Topics Concern  . Not on file  Social History Narrative  . Not on file    No past surgical history on file.  No family history on file.  No Known Allergies  Current Outpatient Medications on File Prior to Visit  Medication Sig Dispense Refill  . diclofenac (VOLTAREN) 75 MG EC tablet Take 1 tablet (75 mg total) by mouth 2 (two) times daily. Use as needed for pain 30 tablet 0  . fexofenadine (ALLEGRA) 180 MG tablet Take by mouth.    Marland Kitchen ibuprofen (ADVIL,MOTRIN) 100 MG tablet Take 100 mg by mouth every 6 (six) hours as needed for fever.    . Melatonin 1 MG CAPS Take by mouth.    . nabumetone (RELAFEN) 500 MG tablet Take 1 tablet (500 mg total) by mouth 2 (two) times daily. 60 tablet 1   No current facility-administered medications on file prior to visit.     There were no vitals taken for this visit.      Objective:   Physical Exam        Assessment & Plan:

## 2019-06-27 NOTE — Patient Instructions (Signed)
Your previous calf discomfort has improved a lot as well as with decreased swelling.  The ultrasound of lower extremity did not reveal DVT.  Would recommend that you use Ace wrap to calf area, elevate and use diclofenac.  Discontinue meloxicam and let us know if you have same abdomen discomfort with diclofenac.  Please update me by this coming Friday you have any residual lower extremity symptoms.  If so then we would refer you to sports medicine.  Follow-up as regular scheduled with your PCP or with me as needed.

## 2019-07-12 ENCOUNTER — Other Ambulatory Visit: Payer: Self-pay | Admitting: Medical

## 2019-07-27 ENCOUNTER — Other Ambulatory Visit: Payer: Self-pay

## 2019-07-27 MED ORDER — DICLOFENAC SODIUM 75 MG PO TBEC: 75.0000 mg | DELAYED_RELEASE_TABLET | Freq: Two times a day (BID) | ORAL | 3 refills | Status: DC

## 2019-07-28 ENCOUNTER — Ambulatory Visit (INDEPENDENT_AMBULATORY_CARE_PROVIDER_SITE_OTHER): Payer: 59

## 2019-07-28 ENCOUNTER — Other Ambulatory Visit: Payer: Self-pay

## 2019-07-28 DIAGNOSIS — Z23 Encounter for immunization: Secondary | ICD-10-CM | POA: Diagnosis not present

## 2019-07-28 NOTE — Progress Notes (Signed)
Flu shot given w/o any complications 

## 2019-08-13 NOTE — Progress Notes (Addendum)
[name] [address], [address] [place], [place] [address] [phone] [phone]  Date:  08/17/2019   Name:  Angela Carson   DOB:  04/23/1971   MRN:  [id number]  PCP:  Pearline Cablesopland, Jerzie Bieri C, MD    Chief Complaint: Annual Exam   History of Present Illness:  Angela Carson is a 48 y.o. very pleasant female patient who presents with the following:  Generally healthy young woman here today for physical exam Last seen by myself November 2019.  She has seen Ramon DredgeEdward a couple of times recently for a calf pain- she thinks she hurt herself doing a home-sewn mask project for the pandemic  She works in a stock room, is married with 2 sons.  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   She has issues with her feet, does see podiatry Never a smoker  Flu shot already done Pap last year Tdap-due, give today  Mammogram 1 year ago- will do next month  Labs 1 year ago- she is not fasting  She is not exercising as much as she would like Otherwise she has no concerns, is feeling well   Her older son and husband are coming to see me next week- she would like her younger son to be seen as well which is fine   LMP last week Wt Readings from Last 3 Encounters:  08/17/19 160 lb (72.6 kg)  06/20/19 161 lb 6.4 oz (73.2 kg)  09/23/18 157 lb (71.2 kg)     Patient Active Problem List   Diagnosis Date Noted  . Foot pain, bilateral 08/12/2017    Past Medical History:  Diagnosis Date  . Multiple food allergies     No past surgical history on file.  Social History   Tobacco Use  . Smoking status: Never Smoker  . Smokeless tobacco: Never Used  Substance Use Topics  . Alcohol use: No  . Drug use: No    No family history on file.  No Known Allergies  Medication list has been reviewed and updated.  Current Outpatient Medications on File Prior to Visit  Medication Sig Dispense Refill  . diclofenac  (VOLTAREN) 75 MG EC tablet Take 1 tablet (75 mg total) by mouth 2 (two) times daily. Use as needed for pain 60 tablet 3  . fexofenadine (ALLEGRA) 180 MG tablet Take by mouth.    Marland Kitchen. ibuprofen (ADVIL,MOTRIN) 100 MG tablet Take 100 mg by mouth every 6 (six) hours as needed for fever.     No current facility-administered medications on file prior to visit.     Review of Systems:  As per HPI- otherwise negative.  Overweight, looks well  Physical Examination: Vitals:   08/17/19 1352  BP: 132/80  Pulse: 92  Resp: 16  Temp: 97.8 F (36.6 C)  SpO2: 98%   Vitals:   08/17/19 1352  Weight: 160 lb (72.6 kg)  Height: 5\' 1"  (1.549 m)   Body mass index is 30.23 kg/m. Ideal Body Weight: Weight in (lb) to have BMI = 25: 132  GEN: WDWN, NAD, Non-toxic, A & O x 3, overweight, looks well  HEENT: Atraumatic, Normocephalic. Neck supple. No masses, No LAD.  Bilateral TM wnl, oropharynx normal.  PEERL,EOMI.   Ears and Nose: No external deformity. CV: RRR, No M/G/R. No JVD. No thrill. No extra heart sounds. PULM: CTA B, no wheezes, crackles, rhonchi. No retractions. No resp. distress.  No accessory muscle use. ABD: S, NT, ND, +BS. No rebound. No HSM. EXTR: No c/c/e NEURO Normal gait.  PSYCH: Normally interactive. Conversant. Not depressed or anxious appearing.  Calm demeanor.    Assessment and Plan: Physical exam  Screening for deficiency anemia - Plan: CBC  Screening for diabetes mellitus - Plan: Comprehensive metabolic panel, Hemoglobin A1c  Screening for hyperlipidemia - Plan: Lipid panel  Screening for thyroid disorder - Plan: TSH  Screening for HIV (human immunodeficiency virus) - Plan: HIV Antibody (routine testing w rflx)  Immunization due - Plan: Tdap vaccine greater than or equal to 7yo IM  CPE today Labs pending as above mammo due next month tdap given Pap uTD Will plan further follow- up pending labs.   Signed Lamar Blinks, MD  Received labs as below,  10/8 Message to patient-diagnosed with diabetes, second A1c greater than 6.5%   Your blood counts are normal Metabolic profile is normal except for high blood sugar HIV is negative, thyroid is normal  I am afraid that your A1c, average blood sugar the previous 3 months, is once again greater than 6.5%.  This means that you do have diabetes.  I am going to send an oral medication for you to take to lower your blood sugar, called metformin.  Take it once a day for a week, and then increase to twice a day.  This may cause some loose stools and gas which typically gets better with time  Please work on increased exercise and try to lose a few pounds.  I would also suggest eating fewer carbs and sweets, more vegetables and protein  Your cholesterol is also high, but this may improve as we get your blood sugar under control  Let me know if any questions, otherwise please see me in 3 months for follow-up   Results for orders placed or performed in visit on 08/17/19  CBC  Result Value Ref Range   WBC 7.6 4.0 - 10.5 K/uL   RBC 4.40 3.87 - 5.11 Mil/uL   Platelets 321.0 150.0 - 400.0 K/uL   Hemoglobin 13.0 12.0 - 15.0 g/dL   HCT 38.9 36.0 - 46.0 %   MCV 88.3 78.0 - 100.0 fl   MCHC 33.4 30.0 - 36.0 g/dL   RDW 13.2 11.5 - 15.5 %  Comprehensive metabolic panel  Result Value Ref Range   Sodium 138 135 - 145 mEq/L   Potassium 4.0 3.5 - 5.1 mEq/L   Chloride 102 96 - 112 mEq/L   CO2 25 19 - 32 mEq/L   Glucose, Bld 232 (H) 70 - 99 mg/dL   BUN 13 6 - 23 mg/dL   Creatinine, Ser 0.62 0.40 - 1.20 mg/dL   Total Bilirubin 0.3 0.2 - 1.2 mg/dL   Alkaline Phosphatase 73 39 - 117 U/L   AST 16 0 - 37 U/L   ALT 17 0 - 35 U/L   Total Protein 7.7 6.0 - 8.3 g/dL   Albumin 4.5 3.5 - 5.2 g/dL   Calcium 9.6 8.4 - 10.5 mg/dL   GFR 102.45 >60.00 mL/min  Hemoglobin A1c  Result Value Ref Range   Hgb A1c MFr Bld 7.7 (H) 4.6 - 6.5 %  Lipid panel  Result Value Ref Range   Cholesterol 228 (H) 0 - 200 mg/dL    Triglycerides 338.0 (H) 0.0 - 149.0 mg/dL   HDL 53.20 >39.00 mg/dL   VLDL 67.6 (H) 0.0 - 40.0 mg/dL   Total CHOL/HDL Ratio 4    NonHDL  174.95   HIV Antibody (routine testing w rflx)  Result Value Ref Range   HIV 1&2 Ab, 4th Generation NON-REACTIVE NON-REACTI  TSH  Result Value Ref Range   TSH 1.11 0.35 - 4.50 uIU/mL  LDL cholesterol, direct  Result Value Ref Range   Direct LDL 159.0 mg/dL

## 2019-08-13 NOTE — Patient Instructions (Signed)
It was great to see you again today, I will be in touch with your labs ASAP- I also did your one time routine HIV screening today You got your routine tetanus booster  Please schedule your mammogram next month  Take care!     Health Maintenance, Female Adopting a healthy lifestyle and getting preventive care are important in promoting health and wellness. Ask your health care provider about:  The right schedule for you to have regular tests and exams.  Things you can do on your own to prevent diseases and keep yourself healthy. What should I know about diet, weight, and exercise? Eat a healthy diet   Eat a diet that includes plenty of vegetables, fruits, low-fat dairy products, and lean protein.  Do not eat a lot of foods that are high in solid fats, added sugars, or sodium. Maintain a healthy weight Body mass index (BMI) is used to identify weight problems. It estimates body fat based on height and weight. Your health care provider can help determine your BMI and help you achieve or maintain a healthy weight. Get regular exercise Get regular exercise. This is one of the most important things you can do for your health. Most adults should:  Exercise for at least 150 minutes each week. The exercise should increase your heart rate and make you sweat (moderate-intensity exercise).  Do strengthening exercises at least twice a week. This is in addition to the moderate-intensity exercise.  Spend less time sitting. Even light physical activity can be beneficial. Watch cholesterol and blood lipids Have your blood tested for lipids and cholesterol at 48 years of age, then have this test every 5 years. Have your cholesterol levels checked more often if:  Your lipid or cholesterol levels are high.  You are older than 48 years of age.  You are at high risk for heart disease. What should I know about cancer screening? Depending on your health history and family history, you may need to  have cancer screening at various ages. This may include screening for:  Breast cancer.  Cervical cancer.  Colorectal cancer.  Skin cancer.  Lung cancer. What should I know about heart disease, diabetes, and high blood pressure? Blood pressure and heart disease  High blood pressure causes heart disease and increases the risk of stroke. This is more likely to develop in people who have high blood pressure readings, are of African descent, or are overweight.  Have your blood pressure checked: ? Every 3-5 years if you are 12-25 years of age. ? Every year if you are 67 years old or older. Diabetes Have regular diabetes screenings. This checks your fasting blood sugar level. Have the screening done:  Once every three years after age 102 if you are at a normal weight and have a low risk for diabetes.  More often and at a younger age if you are overweight or have a high risk for diabetes. What should I know about preventing infection? Hepatitis B If you have a higher risk for hepatitis B, you should be screened for this virus. Talk with your health care provider to find out if you are at risk for hepatitis B infection. Hepatitis C Testing is recommended for:  Everyone born from [birth year] through 1965.  Anyone with known risk factors for hepatitis C. Sexually transmitted infections (STIs)  Get screened for STIs, including gonorrhea and chlamydia, if: ? You are sexually active and are younger than 48 years of age. ? You are older than 48  years of age and your health care provider tells you that you are at risk for this type of infection. ? Your sexual activity has changed since you were last screened, and you are at increased risk for chlamydia or gonorrhea. Ask your health care provider if you are at risk.  Ask your health care provider about whether you are at high risk for HIV. Your health care provider may recommend a prescription medicine to help prevent HIV infection. If you choose to  take medicine to prevent HIV, you should first get tested for HIV. You should then be tested every 3 months for as long as you are taking the medicine. Pregnancy  If you are about to stop having your period (premenopausal) and you may become pregnant, seek counseling before you get pregnant.  Take 400 to 800 micrograms (mcg) of folic acid every day if you become pregnant.  Ask for birth control (contraception) if you want to prevent pregnancy. Osteoporosis and menopause Osteoporosis is a disease in which the bones lose minerals and strength with aging. This can result in bone fractures. If you are 52 years old or older, or if you are at risk for osteoporosis and fractures, ask your health care provider if you should:  Be screened for bone loss.  Take a calcium or vitamin D supplement to lower your risk of fractures.  Be given hormone replacement therapy (HRT) to treat symptoms of menopause. Follow these instructions at home: Lifestyle  Do not use any products that contain nicotine or tobacco, such as cigarettes, e-cigarettes, and chewing tobacco. If you need help quitting, ask your health care provider.  Do not use street drugs.  Do not share needles.  Ask your health care provider for help if you need support or information about quitting drugs. Alcohol use  Do not drink alcohol if: ? Your health care provider tells you not to drink. ? You are pregnant, may be pregnant, or are planning to become pregnant.  If you drink alcohol: ? Limit how much you use to 0-1 drink a day. ? Limit intake if you are breastfeeding.  Be aware of how much alcohol is in your drink. In the [place] one drink equals one 12 oz bottle of beer (355 mL), one 5 oz glass of wine (148 mL), or one 1 oz glass of hard liquor (44 mL). General instructions  Schedule regular health, dental, and eye exams.  Stay current with your vaccines.  Tell your health care provider if: ? You often feel depressed. ? You  have ever been abused or do not feel safe at home. Summary  Adopting a healthy lifestyle and getting preventive care are important in promoting health and wellness.  Follow your health care provider's instructions about healthy diet, exercising, and getting tested or screened for diseases.  Follow your health care provider's instructions on monitoring your cholesterol and blood pressure. This information is not intended to replace advice given to you by your health care provider. Make sure you discuss any questions you have with your health care provider. Document Released: 05/12/2011 Document Revised: 10/20/2018 Document Reviewed: 10/20/2018 Elsevier Patient Education  2020 [employer].

## 2019-08-17 ENCOUNTER — Ambulatory Visit (INDEPENDENT_AMBULATORY_CARE_PROVIDER_SITE_OTHER): Payer: 59 | Admitting: Family Medicine

## 2019-08-17 ENCOUNTER — Encounter: Payer: Self-pay | Admitting: Family Medicine

## 2019-08-17 ENCOUNTER — Other Ambulatory Visit: Payer: Self-pay

## 2019-08-17 VITALS — BP 132/80 | HR 92 | Temp 97.8°F | Resp 16 | Ht 61.0 in | Wt 160.0 lb

## 2019-08-17 DIAGNOSIS — Z131 Encounter for screening for diabetes mellitus: Secondary | ICD-10-CM

## 2019-08-17 DIAGNOSIS — Z23 Encounter for immunization: Secondary | ICD-10-CM

## 2019-08-17 DIAGNOSIS — Z130 Encounter for screening for diseases of the blood and blood-forming organs and certain disorders involving the immune mechanism: Secondary | ICD-10-CM

## 2019-08-17 DIAGNOSIS — Z1329 Encounter for screening for other suspected endocrine disorder: Secondary | ICD-10-CM | POA: Diagnosis not present

## 2019-08-17 DIAGNOSIS — Z114 Encounter for screening for human immunodeficiency virus [HIV]: Secondary | ICD-10-CM

## 2019-08-17 DIAGNOSIS — Z13220 Encounter for screening for lipoid disorders: Secondary | ICD-10-CM | POA: Diagnosis not present

## 2019-08-17 DIAGNOSIS — Z0000 Encounter for general adult medical examination without abnormal findings: Secondary | ICD-10-CM

## 2019-08-17 DIAGNOSIS — E119 Type 2 diabetes mellitus without complications: Secondary | ICD-10-CM

## 2019-08-18 ENCOUNTER — Encounter: Payer: Self-pay | Admitting: Family Medicine

## 2019-08-18 DIAGNOSIS — E119 Type 2 diabetes mellitus without complications: Secondary | ICD-10-CM | POA: Insufficient documentation

## 2019-08-18 LAB — CBC
HCT: 38.9 % (ref 36.0–46.0)
Hemoglobin: 13 g/dL (ref 12.0–15.0)
MCHC: 33.4 g/dL (ref 30.0–36.0)
MCV: 88.3 fl (ref 78.0–100.0)
Platelets: 321 10*3/uL (ref 150.0–400.0)
RBC: 4.4 Mil/uL (ref 3.87–5.11)
RDW: 13.2 % (ref 11.5–15.5)
WBC: 7.6 10*3/uL (ref 4.0–10.5)

## 2019-08-18 LAB — COMPREHENSIVE METABOLIC PANEL
ALT: 17 U/L (ref 0–35)
AST: 16 U/L (ref 0–37)
Albumin: 4.5 g/dL (ref 3.5–5.2)
Alkaline Phosphatase: 73 U/L (ref 39–117)
BUN: 13 mg/dL (ref 6–23)
CO2: 25 mEq/L (ref 19–32)
Calcium: 9.6 mg/dL (ref 8.4–10.5)
Chloride: 102 mEq/L (ref 96–112)
Creatinine, Ser: 0.62 mg/dL (ref 0.40–1.20)
GFR: 102.45 mL/min (ref >60.00)
Glucose, Bld: 232 mg/dL — ABNORMAL HIGH (ref 70–99)
Potassium: 4 mEq/L (ref 3.5–5.1)
Sodium: 138 mEq/L (ref 135–145)
Total Bilirubin: 0.3 mg/dL (ref 0.2–1.2)
Total Protein: 7.7 g/dL (ref 6.0–8.3)

## 2019-08-18 LAB — LIPID PANEL
Cholesterol: 228 mg/dL — ABNORMAL HIGH (ref 0–200)
HDL: 53.2 mg/dL (ref >39.00)
NonHDL: 174.95
Total CHOL/HDL Ratio: 4
Triglycerides: 338 mg/dL — ABNORMAL HIGH (ref 0.0–149.0)
VLDL: 67.6 mg/dL — ABNORMAL HIGH (ref 0.0–40.0)

## 2019-08-18 LAB — TSH: TSH: 1.11 u[IU]/mL (ref 0.35–4.50)

## 2019-08-18 LAB — HIV ANTIBODY (ROUTINE TESTING W REFLEX): HIV 1&2 Ab, 4th Generation: NONREACTIVE

## 2019-08-18 LAB — LDL CHOLESTEROL, DIRECT: Direct LDL: 159 mg/dL

## 2019-08-18 LAB — HEMOGLOBIN A1C: Hgb A1c MFr Bld: 7.7 % — ABNORMAL HIGH (ref 4.6–6.5)

## 2019-08-18 MED ORDER — METFORMIN HCL 500 MG PO TABS: 500.0000 mg | ORAL_TABLET | Freq: Two times a day (BID) | ORAL | 3 refills | Status: DC

## 2019-08-18 NOTE — Addendum Note (Signed)
Addended by: Lamar Blinks C on: 08/18/2019 05:49 PM   Modules accepted: Orders

## 2019-09-26 ENCOUNTER — Other Ambulatory Visit (HOSPITAL_BASED_OUTPATIENT_CLINIC_OR_DEPARTMENT_OTHER): Payer: Self-pay | Admitting: Family Medicine

## 2019-09-26 DIAGNOSIS — Z1231 Encounter for screening mammogram for malignant neoplasm of breast: Secondary | ICD-10-CM

## 2019-09-27 ENCOUNTER — Ambulatory Visit (HOSPITAL_BASED_OUTPATIENT_CLINIC_OR_DEPARTMENT_OTHER)
Admission: RE | Admit: 2019-09-27 | Discharge: 2019-09-27 | Disposition: A | Discharge status: Home / Self Care | Payer: 59 | Source: Ambulatory Visit | Attending: Family Medicine | Admitting: Family Medicine

## 2019-09-27 ENCOUNTER — Other Ambulatory Visit: Payer: Self-pay

## 2019-09-27 DIAGNOSIS — Z1231 Encounter for screening mammogram for malignant neoplasm of breast: Secondary | ICD-10-CM | POA: Diagnosis present

## 2019-11-08 ENCOUNTER — Encounter: Payer: Self-pay | Admitting: Family Medicine

## 2019-11-23 ENCOUNTER — Encounter: Payer: Self-pay | Admitting: Family Medicine

## 2019-11-25 ENCOUNTER — Other Ambulatory Visit: Payer: Self-pay

## 2019-11-27 NOTE — Progress Notes (Addendum)
Angela Carson at [facility] [address] [place], [place] [address] [phone]  Date:  11/28/2019   Name:  Angela Carson   DOB:  05-30-1971   MRN:  [id number]  PCP:  Darreld Mclean, MD    Chief Complaint: Dysmenorrhea (painful, trouble walking, heaviness)   History of Present Illness:  Angela Carson is a 49 y.o. very pleasant female patient who presents with the following:  Patient with history of diabetes, here today with concern of menstrual pain and headache Last seen by myself in October for physical At that time her labs looked overall good, except her A1c had gone up to 7.7%.  I started her on Metformin- she admits that she is not actually taking this most of the time as she tends to forget  She will start back taking this -we will request a refill from her pharmacy  Eye exam Foot exam due-take care today Urine microalbumin Suggest pneumonia vaccine Flu vaccine up-to-date  She notes that her menstrual periods are more irregular, but becoming heavier and she has more cramping and pain sometimes the cramping is severe and very painful, may cause her to miss work She has noted this for about one year Her bleeding generally lasts just about 3 days Her cycles are somewhat irregular, she may miss a month now and again  Never had this sort of problem in the past  She was using depo- provera for several years- she stopped using it 7 years ago and had normal menstruation until about one year ago  Her last pap was 2019  She is a never smoker   She also notes some headaches which she thinks are due to fatigue.  She notes a lot of difficulty sleeping due to stress-she has tried melatonin without success Patient Active Problem List   Diagnosis Date Noted  . Controlled type 2 diabetes mellitus without complication, without long-term current use of insulin ([place]) 08/18/2019  . Foot pain, bilateral 08/12/2017    Past Medical  History:  Diagnosis Date  . Multiple food allergies     History reviewed. No pertinent surgical history.  Social History   Tobacco Use  . Smoking status: Never Smoker  . Smokeless tobacco: Never Used  Substance Use Topics  . Alcohol use: No  . Drug use: No    History reviewed. No pertinent family history.  No Known Allergies  Medication list has been reviewed and updated.  Current Outpatient Medications on File Prior to Visit  Medication Sig Dispense Refill  . diclofenac (VOLTAREN) 75 MG EC tablet Take 1 tablet (75 mg total) by mouth 2 (two) times daily. Use as needed for pain 60 tablet 3  . fexofenadine (ALLEGRA) 180 MG tablet Take by mouth.    . metFORMIN (GLUCOPHAGE) 500 MG tablet Take 1 tablet (500 mg total) by mouth 2 (two) times daily with a meal. 180 tablet 3   No current facility-administered medications on file prior to visit.    Review of Systems:  As per HPI- otherwise negative.  No fever or chills Physical Examination: Vitals:   11/28/19 1524 11/28/19 1552  BP: (!) 132/94 125/85  Pulse: (!) 101 96  Resp: 16   Temp: (!) 96.7 F (35.9 C)   SpO2: 98%    Vitals:   11/28/19 1524  Weight: 159 lb (72.1 kg)  Height: 5\' 1"  (1.549 m)   Body mass index is 30.04 kg/m. Ideal Body Weight:  Weight in (lb) to have BMI = 25: 132  GEN: WDWN, NAD, Non-toxic, A & O x 3, obese, looks well  HEENT: Atraumatic, Normocephalic. Neck supple. No masses, No LAD. Ears and Nose: No external deformity. CV: RRR, No M/G/R. No JVD. No thrill. No extra heart sounds. PULM: CTA B, no wheezes, crackles, rhonchi. No retractions. No resp. distress. No accessory muscle use. ABD: S, NT, ND, +BS. No rebound. No HSM. EXTR: No c/c/e NEURO Normal gait.  PSYCH: Normally interactive. Conversant. Not depressed or anxious appearing.  Calm demeanor.  Foot exam normal today  Pelvic: normal, no vaginal lesions or discharge. Uterus normal, no CMT, no adnexal tendereness or masses No current  evidence of bleeding  Lab Results  Component Value Date   HGBA1C 6.7 (H) 11/28/2019     Assessment and Plan: Menorrhagia with irregular cycle - Plan: POCT urine pregnancy, Cervicovaginal ancillary only( Angela Carson), CBC, FSH, Ferritin, Ambulatory referral to Obstetrics / Gynecology  Controlled type 2 diabetes mellitus without complication, without long-term current use of insulin (HCC) - Plan: Hemoglobin A1c  Screening for cervical cancer - Plan: Cytology - PAP  Adjustment insomnia - Plan: traZODone (DESYREL) 50 MG tablet  Here today with a couple of concerns Menorrhagia and painful periods for about 1 year.  Labs pending as above.  Likely will refer to gynecology Suggested ibuprofen 400 to 600 mg 3 times daily during.  Days A1c pending today for diabetes-start back on Metformin For insomnia, will try trazodone.  I have asked her to let me know if this is not helpful, and also if her headaches do not resolve Moderate medical decision making This visit occurred during the SARS-CoV-2 public health emergency.  Safety protocols were in place, including screening questions prior to the visit, additional usage of staff PPE, and extensive cleaning of exam room while observing appropriate contact time as indicated for disinfecting solutions.    Signed Abbe Amsterdam, MD  Received her labs 1/19, message to patient A1c is improved [facility] shows she is not in menopause Results for orders placed or performed in visit on 11/28/19  CBC  Result Value Ref Range   WBC 8.1 4.0 - 10.5 K/uL   RBC 4.34 3.87 - 5.11 Mil/uL   Platelets 321.0 150.0 - 400.0 K/uL   Hemoglobin 12.9 12.0 - 15.0 g/dL   HCT 63.8 75.6 - 43.3 %   MCV 88.9 78.0 - 100.0 fl   MCHC 33.5 30.0 - 36.0 g/dL   RDW 29.5 18.8 - 41.6 %  FSH  Result Value Ref Range   FSH 2.3 mIU/ML  Ferritin  Result Value Ref Range   Ferritin 12.3 10.0 - 291.0 ng/mL  Hemoglobin A1c  Result Value Ref Range   Hgb A1c MFr Bld 6.7 (H) 4.6 - 6.5 %   POCT urine pregnancy  Result Value Ref Range   Preg Test, Ur Negative Negative  Cytology - PAP  Result Value Ref Range   High risk HPV Negative    Adequacy      Satisfactory for evaluation; transformation zone component ABSENT.   Diagnosis      - Negative for intraepithelial lesion or malignancy (NILM)   Comment Normal Reference Range HPV - Negative   Cervicovaginal ancillary only( )  Result Value Ref Range   Neisseria Gonorrhea Negative    Chlamydia Negative    Trichomonas Negative    Comment Normal Reference [place] Chlamydia - Negative    Comment      Normal Reference Range  Neisseria Gonorrhea - Negative   Comment Normal Reference Range Trichomonas - Negative    addnd 1/20- received her wet prep- negative  Result to pt

## 2019-11-27 NOTE — Patient Instructions (Addendum)
It was good to see you again today! I will be in touch with your labs We will likely refer you to GYN for further evaluation of your bleeding- you may need an ultrasound  Please start on ibuprofen 400- 600 mg three times a day as needed for pain when you next period starts  You can start on the metformin again- take it once a day for 2 weeks,then try twice a day   We will also try trazodone for your insomnia- let me know how his works for you

## 2019-11-28 ENCOUNTER — Other Ambulatory Visit (HOSPITAL_COMMUNITY)
Admission: RE | Admit: 2019-11-28 | Discharge: 2019-11-28 | Disposition: A | Discharge status: Home / Self Care | Payer: 59 | Source: Ambulatory Visit | Attending: Family Medicine | Admitting: Family Medicine

## 2019-11-28 ENCOUNTER — Ambulatory Visit (INDEPENDENT_AMBULATORY_CARE_PROVIDER_SITE_OTHER): Payer: 59 | Admitting: Family Medicine

## 2019-11-28 ENCOUNTER — Other Ambulatory Visit: Payer: Self-pay

## 2019-11-28 ENCOUNTER — Encounter: Payer: Self-pay | Admitting: Family Medicine

## 2019-11-28 VITALS — BP 125/85 | HR 96 | Temp 96.7°F | Resp 16 | Ht 61.0 in | Wt 159.0 lb

## 2019-11-28 DIAGNOSIS — N921 Excessive and frequent menstruation with irregular cycle: Secondary | ICD-10-CM

## 2019-11-28 DIAGNOSIS — F5102 Adjustment insomnia: Secondary | ICD-10-CM | POA: Diagnosis not present

## 2019-11-28 DIAGNOSIS — Z124 Encounter for screening for malignant neoplasm of cervix: Secondary | ICD-10-CM

## 2019-11-28 DIAGNOSIS — E119 Type 2 diabetes mellitus without complications: Secondary | ICD-10-CM | POA: Diagnosis not present

## 2019-11-28 LAB — POCT URINE PREGNANCY: Preg Test, Ur: NEGATIVE

## 2019-11-28 MED ORDER — TRAZODONE HCL 50 MG PO TABS: 25.0000 mg | ORAL_TABLET | Freq: Every evening | ORAL | 3 refills | Status: DC | PRN

## 2019-11-29 ENCOUNTER — Encounter: Payer: Self-pay | Admitting: Family Medicine

## 2019-11-29 LAB — CBC
HCT: 38.6 % (ref 36.0–46.0)
Hemoglobin: 12.9 g/dL (ref 12.0–15.0)
MCHC: 33.5 g/dL (ref 30.0–36.0)
MCV: 88.9 fl (ref 78.0–100.0)
Platelets: 321 10*3/uL (ref 150.0–400.0)
RBC: 4.34 Mil/uL (ref 3.87–5.11)
RDW: 13.4 % (ref 11.5–15.5)
WBC: 8.1 10*3/uL (ref 4.0–10.5)

## 2019-11-29 LAB — HEMOGLOBIN A1C: Hgb A1c MFr Bld: 6.7 % — ABNORMAL HIGH (ref 4.6–6.5)

## 2019-11-29 LAB — FOLLICLE STIMULATING HORMONE: FSH: 2.3 m[IU]/mL

## 2019-11-29 LAB — FERRITIN: Ferritin: 12.3 ng/mL (ref 10.0–291.0)

## 2019-11-29 NOTE — Addendum Note (Signed)
Addended by: Abbe Amsterdam C on: 11/29/2019 07:22 PM   Modules accepted: Orders

## 2019-11-30 LAB — CERVICOVAGINAL ANCILLARY ONLY
Chlamydia: NEGATIVE
Comment: NEGATIVE
Comment: NEGATIVE
Comment: NORMAL
Neisseria Gonorrhea: NEGATIVE
Trichomonas: NEGATIVE

## 2019-11-30 LAB — CYTOLOGY - PAP
Adequacy: ABSENT
Comment: NEGATIVE
Diagnosis: NEGATIVE
High risk HPV: NEGATIVE

## 2020-02-13 ENCOUNTER — Encounter: Payer: Self-pay | Admitting: Family Medicine

## 2020-02-14 ENCOUNTER — Other Ambulatory Visit: Payer: Self-pay

## 2020-02-14 NOTE — Progress Notes (Signed)
[facility] at [facility] [place], [place], [place] [address] [phone]  Date:  02/15/2020   Name:  Angela Carson   DOB:  [birth year]   MRN:  [id number]  PCP:  Darreld Mclean, MD    Chief Complaint: Edema (bilateral leg swelling, one week ago)   History of Present Illness:  Angela Carson is a 49 y.o. very pleasant female patient who presents with the following:  Patient with history of diabetes, here today with concern of leg swelling  Last seen by myself in January of this year with concern of increased menstrual bleeding Her last A1c showed good control diabetes Lab Results  Component Value Date   HGBA1C 6.7 (H) 11/28/2019   Eye exam Urine microalbumin Can suggest pneumococcal vaccine  A couple of weeks ago she noted onset of swelling of her bilateral legs  She stood at home for about 8 hours as she was sewing- was just standing, not walking much  Her husband massaged her legs and she elevated them, and they got better Then last week she was on her feet cooking for about 4 hours and the same thing happened although not as bad Non painful This is a new issue Both legs the same  Wt Readings from Last 3 Encounters:  02/15/20 156 lb (70.8 kg)  11/28/19 159 lb (72.1 kg)  08/17/19 160 lb (72.6 kg)   No orthopnea, weight is stable to lower than normal  No CP or SOB   Patient Active Problem List   Diagnosis Date Noted  . Controlled type 2 diabetes mellitus without complication, without long-term current use of insulin ([place]) 08/18/2019  . Foot pain, bilateral 08/12/2017    Past Medical History:  Diagnosis Date  . Multiple food allergies     No past surgical history on file.  Social History   Tobacco Use  . Smoking status: Never Smoker  . Smokeless tobacco: Never Used  Substance Use Topics  . Alcohol use: No  . Drug use: No    No family history on file.  No Known Allergies  Medication list  has been reviewed and updated.  Current Outpatient Medications on File Prior to Visit  Medication Sig Dispense Refill  . diclofenac (VOLTAREN) 75 MG EC tablet Take 1 tablet (75 mg total) by mouth 2 (two) times daily. Use as needed for pain 60 tablet 3  . fexofenadine (ALLEGRA) 180 MG tablet Take by mouth.    . metFORMIN (GLUCOPHAGE) 500 MG tablet Take 1 tablet (500 mg total) by mouth 2 (two) times daily with a meal. 180 tablet 3   No current facility-administered medications on file prior to visit.    Review of Systems:  As per HPI- otherwise negative.   Physical Examination: Vitals:   02/15/20 1314  BP: 132/85  Pulse: 94  Resp: 16  Temp: 97.8 F (36.6 C)  SpO2: 95%   Vitals:   02/15/20 1314  Weight: 156 lb (70.8 kg)  Height: 5\' 1"  (1.549 m)   Body mass index is 29.48 kg/m. Ideal Body Weight: Weight in (lb) to have BMI = 25: 132  GEN: no acute distress. HEENT: Atraumatic, Normocephalic.  Ears and Nose: No external deformity. CV: RRR, No M/G/R. No JVD. No thrill. No extra heart sounds. PULM: CTA B, no wheezes, crackles, rhonchi. No retractions. No resp. distress. No accessory muscle use. ABD: S, NT, ND, +BS. No rebound. No HSM. EXTR: No c/c.  Trace edema of both LE Strong pulses, feet are warm and well perfused  PSYCH: Normally interactive. Conversant.    Assessment and Plan: Controlled type 2 diabetes mellitus without complication, without long-term current use of insulin (HCC) - Plan: Basic metabolic panel, Microalbumin / creatinine urine ratio, Hemoglobin A1c  Lower extremity edema - Plan: Microalbumin / creatinine urine ratio, US Venous Img Lower Bilateral (DVT)  Here today with complaint of swelling in her bilateral feet after prolonged standing on 2 occasions in the last 2 weeks Resolved after elevation and rest  Counseled likely venous insufficiency- wear compression, elevated legs, walk frequently to minimize sx  Lab eval as above today Ordered doppler to  rule out DVT (less likely)- pt is not sure if she wants to do this, wishes to delay- will schedule if desires  This visit occurred during the SARS-CoV-2 public health emergency.  Safety protocols were in place, including screening questions prior to the visit, additional usage of staff PPE, and extensive cleaning of exam room while observing appropriate contact time as indicated for disinfecting solutions.    Signed Abbe Amsterdam, MD  received her labs so far- message to pt  Results for orders placed or performed in visit on 02/15/20  Basic metabolic panel  Result Value Ref Range   Sodium 136 135 - 145 mEq/L   Potassium 3.7 3.5 - 5.1 mEq/L   Chloride 100 96 - 112 mEq/L   CO2 25 19 - 32 mEq/L   Glucose, Bld 176 (H) 70 - 99 mg/dL   BUN 9 6 - 23 mg/dL   Creatinine, Ser 1.30 0.40 - 1.20 mg/dL   GFR 86.57 >84.69 mL/min   Calcium 9.3 8.4 - 10.5 mg/dL  Microalbumin / creatinine urine ratio  Result Value Ref Range   Microalb, Ur 4.3 (H) 0.0 - 1.9 mg/dL   Creatinine,U 62.9 mg/dL   Microalb Creat Ratio 10.5 0.0 - 30.0 mg/g  Hemoglobin A1c  Result Value Ref Range   Hgb A1c MFr Bld 6.9 (H) 4.6 - 6.5 %

## 2020-02-14 NOTE — Patient Instructions (Addendum)
It was good to see you again today-  I will be in touch with your labs and your ultrasound report.  Assuming no sign of kidney failure or blood clot, your swelling is likely due to "leaky veins" that cause your legs to swell during prolonged standing.  I would suggest that you wear compression socks if you will be standing for a long time, and also take breaks to walk or elevate your legs  Please let me know if this is getting worse!

## 2020-02-15 ENCOUNTER — Other Ambulatory Visit: Payer: Self-pay

## 2020-02-15 ENCOUNTER — Encounter: Payer: Self-pay | Admitting: Family Medicine

## 2020-02-15 ENCOUNTER — Ambulatory Visit (INDEPENDENT_AMBULATORY_CARE_PROVIDER_SITE_OTHER): Payer: 59 | Admitting: Family Medicine

## 2020-02-15 VITALS — BP 132/85 | HR 94 | Temp 97.8°F | Resp 16 | Ht 61.0 in | Wt 156.0 lb

## 2020-02-15 DIAGNOSIS — R600 Localized edema: Secondary | ICD-10-CM

## 2020-02-15 DIAGNOSIS — E119 Type 2 diabetes mellitus without complications: Secondary | ICD-10-CM | POA: Diagnosis not present

## 2020-02-15 LAB — BASIC METABOLIC PANEL
BUN: 9 mg/dL (ref 6–23)
CO2: 25 mEq/L (ref 19–32)
Calcium: 9.3 mg/dL (ref 8.4–10.5)
Chloride: 100 mEq/L (ref 96–112)
Creatinine, Ser: 0.73 mg/dL (ref 0.40–1.20)
GFR: 84.68 mL/min (ref >60.00)
Glucose, Bld: 176 mg/dL — ABNORMAL HIGH (ref 70–99)
Potassium: 3.7 mEq/L (ref 3.5–5.1)
Sodium: 136 mEq/L (ref 135–145)

## 2020-02-15 LAB — MICROALBUMIN / CREATININE URINE RATIO
Creatinine,U: 41.5 mg/dL
Microalb Creat Ratio: 10.5 mg/g (ref 0.0–30.0)
Microalb, Ur: 4.3 mg/dL — ABNORMAL HIGH (ref 0.0–1.9)

## 2020-02-15 LAB — HEMOGLOBIN A1C: Hgb A1c MFr Bld: 6.9 % — ABNORMAL HIGH (ref 4.6–6.5)

## 2020-07-27 ENCOUNTER — Encounter: Payer: Self-pay | Admitting: Family Medicine

## 2020-08-06 ENCOUNTER — Ambulatory Visit (INDEPENDENT_AMBULATORY_CARE_PROVIDER_SITE_OTHER): Payer: 59 | Admitting: Family Medicine

## 2020-08-06 ENCOUNTER — Encounter: Payer: Self-pay | Admitting: Family Medicine

## 2020-08-06 ENCOUNTER — Other Ambulatory Visit: Payer: Self-pay

## 2020-08-06 VITALS — BP 130/80 | HR 97 | Resp 16 | Ht 61.0 in | Wt 161.0 lb

## 2020-08-06 DIAGNOSIS — Z1159 Encounter for screening for other viral diseases: Secondary | ICD-10-CM | POA: Diagnosis not present

## 2020-08-06 DIAGNOSIS — Z0000 Encounter for general adult medical examination without abnormal findings: Secondary | ICD-10-CM

## 2020-08-06 DIAGNOSIS — N912 Amenorrhea, unspecified: Secondary | ICD-10-CM | POA: Diagnosis not present

## 2020-08-06 DIAGNOSIS — Z1211 Encounter for screening for malignant neoplasm of colon: Secondary | ICD-10-CM

## 2020-08-06 DIAGNOSIS — Z1329 Encounter for screening for other suspected endocrine disorder: Secondary | ICD-10-CM

## 2020-08-06 DIAGNOSIS — Z23 Encounter for immunization: Secondary | ICD-10-CM

## 2020-08-06 DIAGNOSIS — Z13220 Encounter for screening for lipoid disorders: Secondary | ICD-10-CM

## 2020-08-06 DIAGNOSIS — E119 Type 2 diabetes mellitus without complications: Secondary | ICD-10-CM

## 2020-08-06 DIAGNOSIS — Z130 Encounter for screening for diseases of the blood and blood-forming organs and certain disorders involving the immune mechanism: Secondary | ICD-10-CM

## 2020-08-06 LAB — POCT URINE PREGNANCY: Preg Test, Ur: NEGATIVE

## 2020-08-06 NOTE — Patient Instructions (Addendum)
It was good to see you again today, I will be in touch with your results as soon as possible Please see me in about 6 months assuming all is well We will order a cologuard kit for you to screen for colon cancer    Health Maintenance, Female Adopting a healthy lifestyle and getting preventive care are important in promoting health and wellness. Ask your health care provider about:  The right schedule for you to have regular tests and exams.  Things you can do on your own to prevent diseases and keep yourself healthy. What should I know about diet, weight, and exercise? Eat a healthy diet   Eat a diet that includes plenty of vegetables, fruits, low-fat dairy products, and lean protein.  Do not eat a lot of foods that are high in solid fats, added sugars, or sodium. Maintain a healthy weight Body mass index (BMI) is used to identify weight problems. It estimates body fat based on height and weight. Your health care provider can help determine your BMI and help you achieve or maintain a healthy weight. Get regular exercise Get regular exercise. This is one of the most important things you can do for your health. Most adults should:  Exercise for at least 150 minutes each week. The exercise should increase your heart rate and make you sweat (moderate-intensity exercise).  Do strengthening exercises at least twice a week. This is in addition to the moderate-intensity exercise.  Spend less time sitting. Even light physical activity can be beneficial. Watch cholesterol and blood lipids Have your blood tested for lipids and cholesterol at 49 years of age, then have this test every 5 years. Have your cholesterol levels checked more often if:  Your lipid or cholesterol levels are high.  You are older than 49 years of age.  You are at high risk for heart disease. What should I know about cancer screening? Depending on your health history and family history, you may need to have cancer  screening at various ages. This may include screening for:  Breast cancer.  Cervical cancer.  Colorectal cancer.  Skin cancer.  Lung cancer. What should I know about heart disease, diabetes, and high blood pressure? Blood pressure and heart disease  High blood pressure causes heart disease and increases the risk of stroke. This is more likely to develop in people who have high blood pressure readings, are of African descent, or are overweight.  Have your blood pressure checked: ? Every 3-5 years if you are 67-36 years of age. ? Every year if you are 67 years old or older. Diabetes Have regular diabetes screenings. This checks your fasting blood sugar level. Have the screening done:  Once every three years after age 10 if you are at a normal weight and have a low risk for diabetes.  More often and at a younger age if you are overweight or have a high risk for diabetes. What should I know about preventing infection? Hepatitis B If you have a higher risk for hepatitis B, you should be screened for this virus. Talk with your health care provider to find out if you are at risk for hepatitis B infection. Hepatitis C Testing is recommended for:  Everyone born from [birth year] through 1965.  Anyone with known risk factors for hepatitis C. Sexually transmitted infections (STIs)  Get screened for STIs, including gonorrhea and chlamydia, if: ? You are sexually active and are younger than 49 years of age. ? You are older than 49  years of age and your health care provider tells you that you are at risk for this type of infection. ? Your sexual activity has changed since you were last screened, and you are at increased risk for chlamydia or gonorrhea. Ask your health care provider if you are at risk.  Ask your health care provider about whether you are at high risk for HIV. Your health care provider may recommend a prescription medicine to help prevent HIV infection. If you choose to take  medicine to prevent HIV, you should first get tested for HIV. You should then be tested every 3 months for as long as you are taking the medicine. Pregnancy  If you are about to stop having your period (premenopausal) and you may become pregnant, seek counseling before you get pregnant.  Take 400 to 800 micrograms (mcg) of folic acid every day if you become pregnant.  Ask for birth control (contraception) if you want to prevent pregnancy. Osteoporosis and menopause Osteoporosis is a disease in which the bones lose minerals and strength with aging. This can result in bone fractures. If you are 62 years old or older, or if you are at risk for osteoporosis and fractures, ask your health care provider if you should:  Be screened for bone loss.  Take a calcium or vitamin D supplement to lower your risk of fractures.  Be given hormone replacement therapy (HRT) to treat symptoms of menopause. Follow these instructions at home: Lifestyle  Do not use any products that contain nicotine or tobacco, such as cigarettes, e-cigarettes, and chewing tobacco. If you need help quitting, ask your health care provider.  Do not use street drugs.  Do not share needles.  Ask your health care provider for help if you need support or information about quitting drugs. Alcohol use  Do not drink alcohol if: ? Your health care provider tells you not to drink. ? You are pregnant, may be pregnant, or are planning to become pregnant.  If you drink alcohol: ? Limit how much you use to 0-1 drink a day. ? Limit intake if you are breastfeeding.  Be aware of how much alcohol is in your drink. In the [place] one drink equals one 12 oz bottle of beer (355 mL), one 5 oz glass of wine (148 mL), or one 1 oz glass of hard liquor (44 mL). General instructions  Schedule regular health, dental, and eye exams.  Stay current with your vaccines.  Tell your health care provider if: ? You often feel depressed. ? You have  ever been abused or do not feel safe at home. Summary  Adopting a healthy lifestyle and getting preventive care are important in promoting health and wellness.  Follow your health care provider's instructions about healthy diet, exercising, and getting tested or screened for diseases.  Follow your health care provider's instructions on monitoring your cholesterol and blood pressure. This information is not intended to replace advice given to you by your health care provider. Make sure you discuss any questions you have with your health care provider. Document Revised: 10/20/2018 Document Reviewed: 10/20/2018 Elsevier Patient Education  2020 [employer].

## 2020-08-06 NOTE — Progress Notes (Signed)
[facility] at [facility] [address] [place], [place] [address] [phone]  Date:  08/06/2020   Name:  Angela Carson   DOB:  [birth year]   MRN:  [id number]  PCP:  Pearline Cables, MD    Chief Complaint: Annual Exam   History of Present Illness:  Angela Carson is a 49 y.o. 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 Controlled type 2 diabetes mellitus without complication, without long-term current use of insulin (HCC) 08/18/2019  . Foot pain, bilateral 08/12/2017    Past Medical History:  Diagnosis Date  . Multiple food allergies     History reviewed. No pertinent surgical history.  Social History   Tobacco Use  . Smoking status: Never Smoker  . Smokeless tobacco: Never Used  Substance Use Topics  . Alcohol use: No  . Drug use: No    History reviewed. No pertinent family history.  No Known Allergies  Medication list has been reviewed and updated.  Current Outpatient Medications on File Prior to Visit  Medication Sig Dispense Refill  . metFORMIN (GLUCOPHAGE) 500 MG tablet Take 1 tablet (500 mg total) by mouth 2 (two) times daily with a meal.  (Patient not taking: Reported on 08/06/2020) 180 tablet 3   No current facility-administered medications on file prior to visit.    Review of Systems:  As per HPI- otherwise negative.   Physical Examination: Vitals:   08/06/20 1458  BP: 130/80  Pulse: 97  Resp: 16  SpO2: 97%   Vitals:   08/06/20 1458  Weight: 161 lb (73 kg)  Height: 5\' 1"  (1.549 m)   Body mass index is 30.42 kg/m. Ideal Body Weight: Weight in (lb) to have BMI = 25: 132  GEN: no acute distress. Obese, otherwise looks well  HEENT: Atraumatic, Normocephalic.   Bilateral TM wnl, oropharynx normal.  PEERL,EOMI.   Ears and Nose: No external deformity. CV: RRR, No M/G/R. No JVD. No thrill. No extra heart sounds. PULM: CTA B, no wheezes, crackles, rhonchi. No retractions. No resp. distress. No accessory muscle use. ABD: S, NT, ND, +BS. No rebound. No HSM. EXTR: No c/c/e PSYCH: Normally interactive. Conversant.   Results for orders placed or performed in visit on 08/06/20  POCT urine pregnancy  Result Value Ref Range   Preg Test, Ur Negative Negative    Assessment and Plan: Physical exam  Screening for deficiency anemia - Plan: CBC  Screening for thyroid disorder - Plan: TSH  Encounter for hepatitis C screening test for low risk patient - Plan: Hepatitis C antibody  Controlled type 2 diabetes mellitus without complication, without long-term current  use of insulin (HCC) - Plan: Comprehensive metabolic panel, Hemoglobin A1c  Screening for hyperlipidemia - Plan: Lipid panel  Needs flu shot - Plan: Flu Vaccine QUAD 6+ mos PF IM (Fluarix Quad PF)  Amenorrhea - Plan: POCT urine pregnancy  Screening for malignant neoplasm of colon   Patient today for physical exam Labs are pending as above Flu vaccine given Other vaccines up-to-date Order Cologuard to screen for colon cancer Patient notes no menstrual period since February of this year, she is worried about pregnancy. hCG negative today, I counseled  her that most likely she is in menopause. She may expect some bleeding off and on over the next several months to a year. If she does go 1 full year without bleeding and then has bleeding please let me know  Encouraged exercise, healthy diet This visit occurred during the SARS-CoV-2 public health emergency.  Safety protocols were in place, including screening questions prior to the visit, additional usage of staff PPE, and extensive cleaning of exam room while observing appropriate contact time as indicated for disinfecting solutions.    Signed Abbe Amsterdam, MD

## 2020-08-09 ENCOUNTER — Other Ambulatory Visit (INDEPENDENT_AMBULATORY_CARE_PROVIDER_SITE_OTHER): Payer: 59

## 2020-08-09 ENCOUNTER — Other Ambulatory Visit: Payer: Self-pay

## 2020-08-09 DIAGNOSIS — Z1329 Encounter for screening for other suspected endocrine disorder: Secondary | ICD-10-CM | POA: Diagnosis not present

## 2020-08-09 DIAGNOSIS — E119 Type 2 diabetes mellitus without complications: Secondary | ICD-10-CM

## 2020-08-09 DIAGNOSIS — Z13220 Encounter for screening for lipoid disorders: Secondary | ICD-10-CM

## 2020-08-09 DIAGNOSIS — Z130 Encounter for screening for diseases of the blood and blood-forming organs and certain disorders involving the immune mechanism: Secondary | ICD-10-CM

## 2020-08-09 DIAGNOSIS — Z1159 Encounter for screening for other viral diseases: Secondary | ICD-10-CM

## 2020-08-10 ENCOUNTER — Encounter: Payer: Self-pay | Admitting: Family Medicine

## 2020-08-10 LAB — COMPREHENSIVE METABOLIC PANEL
AG Ratio: 1.3 (calc) (ref 1.0–2.5)
ALT: 16 U/L (ref 6–29)
AST: 16 U/L (ref 10–35)
Albumin: 4.3 g/dL (ref 3.6–5.1)
Alkaline phosphatase (APISO): 71 U/L (ref 31–125)
BUN: 11 mg/dL (ref 7–25)
CO2: 22 mmol/L (ref 20–32)
Calcium: 9.7 mg/dL (ref 8.6–10.2)
Chloride: 102 mmol/L (ref 98–110)
Creat: 0.64 mg/dL (ref 0.50–1.10)
Globulin: 3.2 g/dL (calc) (ref 1.9–3.7)
Glucose, Bld: 133 mg/dL — ABNORMAL HIGH (ref 65–99)
Potassium: 4.4 mmol/L (ref 3.5–5.3)
Sodium: 139 mmol/L (ref 135–146)
Total Bilirubin: 0.4 mg/dL (ref 0.2–1.2)
Total Protein: 7.5 g/dL (ref 6.1–8.1)

## 2020-08-10 LAB — LIPID PANEL
Cholesterol: 233 mg/dL — ABNORMAL HIGH (ref <200)
HDL: 62 mg/dL (ref >=50)
LDL Cholesterol (Calc): 146 mg/dL (calc) — ABNORMAL HIGH
Non-HDL Cholesterol (Calc): 171 mg/dL (calc) — ABNORMAL HIGH (ref <130)
Total CHOL/HDL Ratio: 3.8 (calc) (ref <5.0)
Triglycerides: 123 mg/dL (ref <150)

## 2020-08-10 LAB — CBC
HCT: 38.5 % (ref 35.0–45.0)
Hemoglobin: 12.9 g/dL (ref 11.7–15.5)
MCH: 29.1 pg (ref 27.0–33.0)
MCHC: 33.5 g/dL (ref 32.0–36.0)
MCV: 86.7 fL (ref 80.0–100.0)
MPV: 10 fL (ref 7.5–12.5)
Platelets: 302 10*3/uL (ref 140–400)
RBC: 4.44 10*6/uL (ref 3.80–5.10)
RDW: 13 % (ref 11.0–15.0)
WBC: 6.8 10*3/uL (ref 3.8–10.8)

## 2020-08-10 LAB — HEPATITIS C ANTIBODY
Hepatitis C Ab: NONREACTIVE
SIGNAL TO CUT-OFF: 0.01 (ref <1.00)

## 2020-08-10 LAB — TSH: TSH: 1.78 mIU/L

## 2020-08-10 LAB — HEMOGLOBIN A1C
Hgb A1c MFr Bld: 7.7 % of total Hgb — ABNORMAL HIGH (ref <5.7)
Mean Plasma Glucose: 174 (calc)
eAG (mmol/L): 9.7 (calc)

## 2020-09-03 ENCOUNTER — Encounter: Payer: 59 | Admitting: Family Medicine

## 2020-09-11 ENCOUNTER — Encounter: Payer: Self-pay | Admitting: Family Medicine

## 2020-09-18 NOTE — Progress Notes (Signed)
[facility] at [facility] [address] [place], [place] [address] [phone]  Date:  09/19/2020   Name:  Angela Carson   DOB:  [birth year]   MRN:  [id number]  PCP:  Pearline Cables, MD    Chief Complaint: Diabetes (6 month follow up) and Medication Management (not taking metformin, discuss cholesterol)   History of Present Illness:  Angela Carson is a 49 y.o. very pleasant female patient who presents with the following:  Patient today to discuss labs.  History of controlled diabetes  Last seen by myself for physical in September; at that time her A1c was slightly high, as were lipids.  She has had difficulty tolerating Metformin due to GI side effects.  Here today to discuss alternative diabetes medication and cholesterol medication  Lab Results  Component Value Date   HGBA1C 7.7 (H) 08/09/2020   Flu vaccine done Can suggest Covid booster- she plans to do this soon   She stopped using metformin close to a year ago- it gave her diarrhea and she really could not tolerate  She is currently not having diabetes medications.  She would be willing to try something different for her glucose control She is on her menses right now- she is perimenopausal.  Last was 8 months ago  She is also willing to start a medication for cholesterol She has noted a silver dollar sized round itchy area on her right posterior neck for about 1 month.  She has tried a couple of over-the-counter remedies without success Also, she is concerned about a possible difference in the lateral right breast She and her family had a wonderful trip to [place] over the summer   Patient Active Problem List   Diagnosis Date Noted  . Controlled type 2 diabetes mellitus without complication, without long-term current use of insulin (HCC) 08/18/2019  . Foot pain, bilateral 08/12/2017    Past Medical History:  Diagnosis Date  . Multiple food allergies      History reviewed. No pertinent surgical history.  Social History   Tobacco Use  . Smoking status: Never Smoker  . Smokeless tobacco: Never Used  Substance Use Topics  . Alcohol use: No  . Drug use: No    History reviewed. No pertinent family history.  No Known Allergies  Medication list has been reviewed and updated.  No current outpatient medications on file prior to visit.   No current facility-administered medications on file prior to visit.    Review of Systems:  As per HPI- otherwise negative.   Physical Examination: Vitals:   09/19/20 1300 09/19/20 1312  BP: (!) 142/98 124/85  Pulse: 72   Resp: 16   SpO2: 98%    Vitals:   09/19/20 1300  Weight: 161 lb (73 kg)  Height: 5\' 1"  (1.549 m)   Body mass index is 30.42 kg/m. Ideal Body Weight: Weight in (lb) to have BMI = 25: 132  GEN: no acute distress. Overweight, looks well  HEENT: Atraumatic, Normocephalic.  Ears and Nose: No external deformity. CV: RRR, No M/G/R. No JVD. No thrill. No extra heart sounds. PULM: CTA B, no wheezes, crackles, rhonchi. No retractions. No resp. distress. No accessory muscle use. ABD: S, NT, ND, +BS. No rebound. No HSM. EXTR: No c/c/e PSYCH: Normally interactive. Conversant.  She notes an itchy, round erythematous area on the posterior right neck.  It has been there for about 1 month, they have tried a  moisturizer and antibiotic cream Also, she feels as though the lateral aspect of the left breast has changed in the last several weeks, it is not necessarily a mass or pain which is something seems different I am not able to palpate a mass at this time  Assessment and Plan: Controlled type 2 diabetes mellitus without complication, without long-term current use of insulin (HCC) - Plan: glipiZIDE (GLUCOTROL XL) 5 MG 24 hr tablet  Dyslipidemia - Plan: rosuvastatin (CRESTOR) 10 MG tablet  Concern about breast cancer in female without diagnosis - Plan: MM DIAG BREAST TOMO  BILATERAL, US BREAST COMPLETE UNI LEFT INC AXILLA  Rash - Plan: nystatin-triamcinolone ointment (MYCOLOG)  Diabetes, not able to tolerate Metformin.  We will have her start on glipizide XL 5.  She does have a glucose meter.  Asked her to check her blood sugar a few times a week, if routinely higher than 200 or so she may increase to 10 mg of glipizide.  She will keep posted about this issue Plan to visit in 3 months for A1c Call in Crestor Triamcinolone/nystatin cream for neck Order diagnostic mammogram and ultrasound This visit occurred during the SARS-CoV-2 public health emergency.  Safety protocols were in place, including screening questions prior to the visit, additional usage of staff PPE, and extensive cleaning of exam room while observing appropriate contact time as indicated for disinfecting solutions.    Signed Abbe Amsterdam, MD

## 2020-09-19 ENCOUNTER — Ambulatory Visit (INDEPENDENT_AMBULATORY_CARE_PROVIDER_SITE_OTHER): Payer: 59 | Admitting: Family Medicine

## 2020-09-19 ENCOUNTER — Other Ambulatory Visit: Payer: Self-pay

## 2020-09-19 ENCOUNTER — Encounter: Payer: Self-pay | Admitting: Family Medicine

## 2020-09-19 VITALS — BP 124/85 | HR 72 | Resp 16 | Ht 61.0 in | Wt 161.0 lb

## 2020-09-19 DIAGNOSIS — E785 Hyperlipidemia, unspecified: Secondary | ICD-10-CM

## 2020-09-19 DIAGNOSIS — R21 Rash and other nonspecific skin eruption: Secondary | ICD-10-CM

## 2020-09-19 DIAGNOSIS — Z711 Person with feared health complaint in whom no diagnosis is made: Secondary | ICD-10-CM | POA: Diagnosis not present

## 2020-09-19 DIAGNOSIS — E119 Type 2 diabetes mellitus without complications: Secondary | ICD-10-CM | POA: Diagnosis not present

## 2020-09-19 MED ORDER — GLIPIZIDE ER 5 MG PO TB24: 5.0000 mg | ORAL_TABLET | Freq: Every day | ORAL | 3 refills | Status: DC

## 2020-09-19 MED ORDER — ROSUVASTATIN CALCIUM 10 MG PO TABS: 10.0000 mg | ORAL_TABLET | Freq: Every day | ORAL | 3 refills | Status: DC

## 2020-09-19 MED ORDER — NYSTATIN-TRIAMCINOLONE 100000-0.1 UNIT/GM-% EX OINT: 1.0000 "application " | TOPICAL_OINTMENT | Freq: Two times a day (BID) | CUTANEOUS | 0 refills | Status: DC

## 2020-09-19 NOTE — Patient Instructions (Signed)
Is good to see you again today, I am glad you had a wonderful trip to [place]  Your blood sugar, we are going to start her on glipizide extended release 5 mg.  Take once daily, in the mornings fine.  Please check your blood sugar a few times a week, if you are running numbers higher than 200 on a regular basis increase to 2 pills.  If not sure what to do, please contact me  Let us visit in 3 months to check A1c  Start on Crestor for cholesterol  I have referred you to the breast center of [facility] imaging for diagnostic mammogram and ultrasound.  If you do not hear from them the next couple of days please give them a call  I prescribed triamcinolone and nystatin cream to try for the area on your neck

## 2020-10-01 ENCOUNTER — Other Ambulatory Visit: Payer: Self-pay

## 2020-10-01 ENCOUNTER — Encounter: Payer: Self-pay | Admitting: Family Medicine

## 2020-10-01 ENCOUNTER — Ambulatory Visit (INDEPENDENT_AMBULATORY_CARE_PROVIDER_SITE_OTHER): Payer: 59 | Admitting: Family Medicine

## 2020-10-01 VITALS — BP 126/88 | HR 75 | Resp 16 | Ht 61.0 in | Wt 162.0 lb

## 2020-10-01 DIAGNOSIS — R21 Rash and other nonspecific skin eruption: Secondary | ICD-10-CM

## 2020-10-01 MED ORDER — VALACYCLOVIR HCL 1 G PO TABS: 1000.0000 mg | ORAL_TABLET | Freq: Three times a day (TID) | ORAL | 0 refills | Status: DC

## 2020-10-01 NOTE — Progress Notes (Signed)
[facility] at [facility] [address] [place], [place] [address] [phone]  Date:  10/01/2020   Name:  Angela Carson   DOB:  Mar 04, 1971   MRN:  [id number]  PCP:  Pearline Cables, MD    Chief Complaint: Rash (one week, itching, redness)   History of Present Illness:  Angela Carson is a 49 y.o. very pleasant female patient who presents with the following:  Pt had contacted me about a rash  Just about one week ago she developed a rash on her neck-is on the anterior lateral left neck only It is not that bothersome- just concerning to look at. Can be a little bit itchy or uncomfortable She feels fine otherwise  She is doing well glipizide so far, tolerating well  No fever /chills  She is not sure if she had chickenpox in the past No other rash on her body  Patient Active Problem List   Diagnosis Date Noted  . Controlled type 2 diabetes mellitus without complication, without long-term current use of insulin (HCC) 08/18/2019  . Foot pain, bilateral 08/12/2017    Past Medical History:  Diagnosis Date  . Multiple food allergies     History reviewed. No pertinent surgical history.  Social History   Tobacco Use  . Smoking status: Never Smoker  . Smokeless tobacco: Never Used  Substance Use Topics  . Alcohol use: No  . Drug use: No    History reviewed. No pertinent family history.  Allergies  Allergen Reactions  . Metformin And Related     Diarrhea, cannot tolerate    Medication list has been reviewed and updated.  Current Outpatient Medications on File Prior to Visit  Medication Sig Dispense Refill  . glipiZIDE (GLUCOTROL XL) 5 MG 24 hr tablet Take 1 tablet (5 mg total) by mouth daily with breakfast. Increase to 10 mg as directed by MD 60 tablet 3  . nystatin-triamcinolone ointment (MYCOLOG) Apply 1 application topically 2 (two) times daily. Use for 2-3 weeks for rash on neck 30 g 0  . rosuvastatin  (CRESTOR) 10 MG tablet Take 1 tablet (10 mg total) by mouth daily. 90 tablet 3   No current facility-administered medications on file prior to visit.    Review of Systems:  As per HPI- otherwise negative.   Physical Examination: Vitals:   10/01/20 1503  BP: 126/88  Pulse: 75  Resp: 16  SpO2: 98%   Vitals:   10/01/20 1503  Weight: 162 lb (73.5 kg)  Height: 5\' 1"  (1.549 m)   Body mass index is 30.61 kg/m. Ideal Body Weight: Weight in (lb) to have BMI = 25: 132  GEN: no acute distress. Obese, otherwise looks well HEENT: Atraumatic, Normocephalic. No oral lesions Ears and Nose: No external deformity. CV: RRR, No M/G/R. No JVD. No thrill. No extra heart sounds. PULM: CTA B, no wheezes, crackles, rhonchi. No retractions. No resp. distress. No accessory muscle use. EXTR: No c/c/e PSYCH: Normally interactive. Conversant.  Pt has a erythematous, palpable rash on the left side of her neck -it is not definitely consistent with appearance of shingles, but does not cross the midline      Assessment and Plan: Rash - Plan: valACYclovir (VALTREX) 1000 MG tablet  Patient today with a rash on her neck. It is located only on the left hand side. Suggestive of somewhat of shingles, versus a nonspecific erythematous breakout. We will have her try Valtrex, she  will let me know if not improving things in the next couple of days If still persistent, we can try an oral steroid  Signed Abbe Amsterdam, MD

## 2020-10-01 NOTE — Patient Instructions (Signed)
I think your rash is shingles as it is just one one side of your body Please try the valtrex rx I gave you If not getting better in a couple of days let me know!  We can try a steroid if we must, but I would like to avoid if possible as these raise your blood sugars

## 2020-10-02 ENCOUNTER — Other Ambulatory Visit (HOSPITAL_BASED_OUTPATIENT_CLINIC_OR_DEPARTMENT_OTHER): Payer: Self-pay | Admitting: Internal Medicine

## 2020-10-02 ENCOUNTER — Ambulatory Visit: Payer: 59 | Attending: Internal Medicine

## 2020-10-02 DIAGNOSIS — Z23 Encounter for immunization: Secondary | ICD-10-CM

## 2020-10-02 NOTE — Progress Notes (Signed)
° °  Covid-19 Vaccination Clinic  Name:  Angela Carson    MRN: [id number] DOB: 1970/12/08  10/02/2020  Ms. Angela Carson was observed post Covid-19 immunization for 15 minutes without incident. She was provided with Vaccine Information Sheet and instruction to access the V-Safe system.   Ms. Angela Carson was instructed to call 911 with any severe reactions post vaccine:  Difficulty breathing   Swelling of face and throat   A fast heartbeat   A bad rash all over body   Dizziness and weakness   Immunizations Administered    No immunizations on file.

## 2020-10-03 ENCOUNTER — Other Ambulatory Visit: Payer: Self-pay | Admitting: Family Medicine

## 2020-10-03 DIAGNOSIS — N6459 Other signs and symptoms in breast: Secondary | ICD-10-CM

## 2020-10-03 MED FILL — MODERNA COVID-19 VACCINE 10: 100 | 1 days supply | Qty: 0 | Fill #0

## 2020-11-12 ENCOUNTER — Encounter: Payer: Self-pay | Admitting: Family Medicine

## 2021-01-15 ENCOUNTER — Other Ambulatory Visit: Payer: Self-pay | Admitting: Family Medicine

## 2021-01-15 DIAGNOSIS — E119 Type 2 diabetes mellitus without complications: Secondary | ICD-10-CM

## 2021-02-16 ENCOUNTER — Other Ambulatory Visit: Payer: Self-pay | Admitting: Family Medicine

## 2021-02-16 DIAGNOSIS — E119 Type 2 diabetes mellitus without complications: Secondary | ICD-10-CM

## 2021-03-04 ENCOUNTER — Other Ambulatory Visit: Payer: Self-pay | Admitting: Family Medicine

## 2021-03-04 DIAGNOSIS — E119 Type 2 diabetes mellitus without complications: Secondary | ICD-10-CM

## 2021-04-11 ENCOUNTER — Encounter: Payer: Self-pay | Admitting: Family Medicine

## 2021-04-12 ENCOUNTER — Ambulatory Visit (INDEPENDENT_AMBULATORY_CARE_PROVIDER_SITE_OTHER): Payer: 59 | Admitting: Family

## 2021-04-12 ENCOUNTER — Other Ambulatory Visit: Payer: Self-pay

## 2021-04-12 ENCOUNTER — Other Ambulatory Visit (HOSPITAL_COMMUNITY)
Admission: RE | Admit: 2021-04-12 | Discharge: 2021-04-12 | Disposition: A | Discharge status: Home / Self Care | Payer: 59 | Source: Ambulatory Visit | Attending: Family | Admitting: Family

## 2021-04-12 ENCOUNTER — Encounter: Payer: Self-pay | Admitting: Family

## 2021-04-12 VITALS — BP 132/90 | HR 102 | Temp 97.7°F | Ht 61.0 in | Wt 169.0 lb

## 2021-04-12 DIAGNOSIS — N760 Acute vaginitis: Secondary | ICD-10-CM

## 2021-04-12 DIAGNOSIS — E119 Type 2 diabetes mellitus without complications: Secondary | ICD-10-CM | POA: Diagnosis not present

## 2021-04-12 DIAGNOSIS — R21 Rash and other nonspecific skin eruption: Secondary | ICD-10-CM

## 2021-04-12 DIAGNOSIS — R300 Dysuria: Secondary | ICD-10-CM | POA: Diagnosis not present

## 2021-04-12 LAB — POCT URINALYSIS DIP (MANUAL ENTRY)
Bilirubin, UA: NEGATIVE
Glucose, UA: >=1000 mg/dL — AB
Nitrite, UA: NEGATIVE
Protein Ur, POC: NEGATIVE mg/dL
Spec Grav, UA: 1.015 (ref 1.010–1.025)
Urobilinogen, UA: 0.2 E.U./dL
pH, UA: 6 (ref 5.0–8.0)

## 2021-04-12 MED ORDER — NYSTATIN-TRIAMCINOLONE 100000-0.1 UNIT/GM-% EX OINT: 1.0000 "application " | TOPICAL_OINTMENT | Freq: Two times a day (BID) | CUTANEOUS | 0 refills | Status: DC

## 2021-04-12 MED ORDER — SULFAMETHOXAZOLE-TRIMETHOPRIM 800-160 MG PO TABS: 1.0000 | ORAL_TABLET | Freq: Two times a day (BID) | ORAL | 0 refills | Status: DC

## 2021-04-12 MED ORDER — FLUCONAZOLE 150 MG PO TABS: ORAL_TABLET | ORAL | 0 refills | Status: DC

## 2021-04-12 NOTE — Patient Instructions (Signed)
You are due for a follow up with Dr. Patsy Lager for your diabetes; Your physical is not actually due until September 2022;  Your pap smear is up to date- it was done in 11/2019- it would be due again in 11/2022.

## 2021-04-12 NOTE — Progress Notes (Signed)
Angela Carson is a 50 y.o. female with the following history as recorded in Epic[employer]:  Patient Active Problem List   Diagnosis Date Noted  . Controlled type 2 diabetes mellitus without complication, without long-term current use of insulin (HCC) 08/18/2019  . Foot pain, bilateral 08/12/2017    Current Outpatient Medications  Medication Sig Dispense Refill  . fluconazole (DIFLUCAN) 150 MG tablet Take 1 tablet today; repeat after 72 hours 2 tablet 0  . glipiZIDE (GLUCOTROL XL) 5 MG 24 hr tablet TAKE 1 TABLET (5 MG TOTAL) BY MOUTH DAILY WITH BREAKFAST. INCREASE TO 10 MG AS DIRECTED BY MD 180 tablet 1  . sulfamethoxazole-trimethoprim (BACTRIM DS) 800-160 MG tablet Take 1 tablet by mouth 2 (two) times daily. 10 tablet 0  . nystatin-triamcinolone ointment (MYCOLOG) Apply 1 application topically 2 (two) times daily. 30 g 0  . rosuvastatin (CRESTOR) 10 MG tablet Take 1 tablet (10 mg total) by mouth daily. (Patient not taking: Reported on 04/12/2021) 90 tablet 3   No current facility-administered medications for this visit.    Allergies: Metformin and related  Past Medical History:  Diagnosis Date  . Multiple food allergies     No past surgical history on file.  No family history on file.  Social History   Tobacco Use  . Smoking status: Never Smoker  . Smokeless tobacco: Never Used  Substance Use Topics  . Alcohol use: No    Subjective:  Patient presents with concerns for possible UTI; notes she has been having urinary urgency and frequency; is also having concerns for vaginal swelling; admits she has changed numerous soaps/ detergents recently to try and help with symptoms and thinks she may have made her symptoms worse.    Objective:  Vitals:   04/12/21 1417  BP: 132/90  Pulse: (!) 102  Temp: 97.7 F (36.5 C)  TempSrc: Oral  SpO2: 97%  Weight: 169 lb (76.7 kg)  Height: 5\' 1"  (1.549 m)    General: Well developed, well nourished, in no acute distress  Skin : Warm and dry.   Head: Normocephalic and atraumatic  Lungs: Respirations unlabored; clear to auscultation bilaterally without wheeze, rales, rhonchi  CVS exam: normal rate and regular rhythm.  Neurologic: Alert and oriented; speech intact; face symmetrical; moves all extremities well; CNII-XII intact without focal deficit  Pelvic exam- external erythema noted;     Assessment:  1. Dysuria   2. Rash   3. Controlled type 2 diabetes mellitus without complication, without long-term current use of insulin (HCC)   4. Acute vaginitis     Plan:  1. Suspect UTI; check U/A and urine culture; will also check wet prep; reassurance given to patient that her pap smear is up to date; start Diflucan and Bactrim; follow up to be determined;  2. Refil on Mycostatin/ Triamcinolone;  3. Overdue to see her PCP for diabetes follow up; encouraged to schedule a follow up;  This visit occurred during the SARS-CoV-2 public health emergency.  Safety protocols were in place, including screening questions prior to the visit, additional usage of staff PPE, and extensive cleaning of exam room while observing appropriate contact time as indicated for disinfecting solutions.     No follow-ups on file.  Orders Placed This Encounter  Procedures  . Urine Culture  . POCT urinalysis dipstick    Requested Prescriptions   Signed Prescriptions Disp Refills  . nystatin-triamcinolone ointment (MYCOLOG) 30 g 0    Sig: Apply 1 application topically 2 (two) times daily.   fluconazole (DIFLUCAN) 150 MG tablet 2 tablet 0    Sig: Take 1 tablet today; repeat after 72 hours  . sulfamethoxazole-trimethoprim (BACTRIM DS) 800-160 MG tablet 10 tablet 0    Sig: Take 1 tablet by mouth 2 (two) times daily.

## 2021-04-14 LAB — URINE CULTURE
MICRO NUMBER:: 11966488
SPECIMEN QUALITY:: ADEQUATE

## 2021-04-15 LAB — CERVICOVAGINAL ANCILLARY ONLY
Bacterial Vaginitis (gardnerella): NEGATIVE
Candida Glabrata: NEGATIVE
Candida Vaginitis: POSITIVE — AB
Comment: NEGATIVE
Comment: NEGATIVE
Comment: NEGATIVE

## 2021-06-13 NOTE — Patient Instructions (Addendum)
Good to see you today- I will be in touch with your labs asap  We will check a blood test to see if you had chicken pox in the past You got your pneumonia vaccine today Ok to get your mammogram anytime now

## 2021-06-13 NOTE — Progress Notes (Addendum)
[facility] at [facility] [address] [place], [place] [address] [phone]  Date:  06/17/2021   Name:  Angela Carson   DOB:  1971-09-18   MRN:  [id number]  PCP:  Pearline Cables, MD    Chief Complaint: Medical Management of Chronic Issues (F/u )   History of Present Illness:  Angela Carson is a 50 y.o. very pleasant female patient who presents with the following:  Pt with history of DM- here today for follow-up  I last saw her in November   Pneumonia vaccine-give Prevnar 20 today Colon cancer screen- she would like to do cologuard  No family history of colon cancer Shingrix-patient is not sure if she ever had chickenpox.  We will check a varicella titer Foot exam  Can update labs today   Her two sons are grown, the youngest is graduating from college. Angela Carson was having some trouble adjusting to "empty nest" but now feels that her mood is better  Admits she has not been getting much exercise, I encouraged her to get back into this routine   Lab Results  Component Value Date   HGBA1C 7.7 (H) 08/09/2020    Glipizide Crestor  Patient Active Problem List   Diagnosis Date Noted   Controlled type 2 diabetes mellitus without complication, without long-term current use of insulin (HCC) 08/18/2019   Foot pain, bilateral 08/12/2017    Past Medical History:  Diagnosis Date   Multiple food allergies     No past surgical history on file.  Social History   Tobacco Use   Smoking status: Never   Smokeless tobacco: Never  Substance Use Topics   Alcohol use: No   Drug use: No    No family history on file.  Allergies  Allergen Reactions   Metformin And Related     Diarrhea, cannot tolerate    Medication list has been reviewed and updated.  Current Outpatient Medications on File Prior to Visit  Medication Sig Dispense Refill   fluconazole (DIFLUCAN) 150 MG tablet Take 1 tablet today; repeat after 72 hours  (Patient not taking: Reported on 06/17/2021) 2 tablet 0   nystatin-triamcinolone ointment (MYCOLOG) Apply 1 application topically 2 (two) times daily. (Patient not taking: Reported on 06/17/2021) 30 g 0   sulfamethoxazole-trimethoprim (BACTRIM DS) 800-160 MG tablet Take 1 tablet by mouth 2 (two) times daily. (Patient not taking: Reported on 06/17/2021) 10 tablet 0   No current facility-administered medications on file prior to visit.    Review of Systems:  As per HPI- otherwise negative. She notes that she does check her blood pressure at home and it tends to be in normal range  Physical Examination: Vitals:   06/17/21 1452 06/17/21 1511  BP: (!) 132/94 128/90  Pulse: (!) 107   Temp: 97.9 F (36.6 C)   SpO2: 97%    Vitals:   06/17/21 1452  Weight: 168 lb 12.8 oz (76.6 kg)  Height: 5\' 1"  (1.549 m)   Body mass index is 31.89 kg/m. Ideal Body Weight: Weight in (lb) to have BMI = 25: 132  GEN: no acute distress.  Obese, looks well HEENT: Atraumatic, Normocephalic.  Ears and Nose: No external deformity. CV: RRR, No M/G/R. No JVD. No thrill. No extra heart sounds. PULM: CTA B, no wheezes, crackles, rhonchi. No retractions. No resp. distress. No accessory muscle use. ABD: S, NT, ND, +BS. No rebound. No HSM. EXTR: No c/c/e PSYCH: Normally  interactive. Conversant.  Foot exam- normal   BP Readings from Last 3 Encounters:  06/17/21 128/90  04/12/21 132/90  10/01/20 126/88   Pulse Readings from Last 3 Encounters:  06/17/21 (!) 107  04/12/21 (!) 102  10/01/20 75     Assessment and Plan: Controlled type 2 diabetes mellitus without complication, without long-term current use of insulin (HCC) - Plan: Comprehensive metabolic panel, Hemoglobin A1c, glipiZIDE (GLUCOTROL XL) 10 MG 24 hr tablet  Screening for deficiency anemia - Plan: CBC  Dyslipidemia - Plan: Lipid panel, rosuvastatin (CRESTOR) 10 MG tablet  Screening for thyroid disorder - Plan: TSH  Fatigue, unspecified type -  Plan: TSH, VITAMIN D 25 Hydroxy (Vit-D Deficiency, Fractures)  Immunization due - Plan: Pneumococcal conjugate vaccine 20-valent (Prevnar 20)  Exposure to varicella - Plan: Varicella zoster antibody, IgG  Screening for colon cancer - Plan: Cologuard  Following up today as above Gave Prevnar 20 Will plan further follow- up pending labs.   This visit occurred during the SARS-CoV-2 public health emergency.  Safety protocols were in place, including screening questions prior to the visit, additional usage of staff PPE, and extensive cleaning of exam room while observing appropriate contact time as indicated for disinfecting solutions.   Signed Abbe Amsterdam, MD  8/9 addnd- received labs as below, message to pt  Results for orders placed or performed in visit on 06/17/21  CBC  Result Value Ref Range   WBC 8.0 4.0 - 10.5 K/uL   RBC 4.29 3.87 - 5.11 Mil/uL   Platelets 307.0 150.0 - 400.0 K/uL   Hemoglobin 12.6 12.0 - 15.0 g/dL   HCT 93.8 10.1 - 75.1 %   MCV 86.9 78.0 - 100.0 fl   MCHC 33.9 30.0 - 36.0 g/dL   RDW 02.5 85.2 - 77.8 %  Comprehensive metabolic panel  Result Value Ref Range   Sodium 134 (L) 135 - 145 mEq/L   Potassium 4.0 3.5 - 5.1 mEq/L   Chloride 100 96 - 112 mEq/L   CO2 25 19 - 32 mEq/L   Glucose, Bld 176 (H) 70 - 99 mg/dL   BUN 12 6 - 23 mg/dL   Creatinine, Ser 2.42 0.40 - 1.20 mg/dL   Total Bilirubin 0.2 0.2 - 1.2 mg/dL   Alkaline Phosphatase 73 39 - 117 U/L   AST 14 0 - 37 U/L   ALT 15 0 - 35 U/L   Total Protein 7.5 6.0 - 8.3 g/dL   Albumin 4.3 3.5 - 5.2 g/dL   GFR 353.61 >44.31 mL/min   Calcium 9.4 8.4 - 10.5 mg/dL  Hemoglobin V4M  Result Value Ref Range   Hgb A1c MFr Bld 7.8 (H) 4.6 - 6.5 %  Lipid panel  Result Value Ref Range   Cholesterol 230 (H) 0 - 200 mg/dL   Triglycerides 086.7 (H) 0.0 - 149.0 mg/dL   HDL 61.95 >09.32 mg/dL   VLDL 67.1 (H) 0.0 - 24.5 mg/dL   Total CHOL/HDL Ratio 4    NonHDL 175.05   TSH  Result Value Ref Range   TSH  1.59 0.35 - 5.50 uIU/mL  VITAMIN D 25 Hydroxy (Vit-D Deficiency, Fractures)  Result Value Ref Range   VITD 26.98 (L) 30.00 - 100.00 ng/mL  Varicella zoster antibody, IgG  Result Value Ref Range   Varicella IgG <135.00 (L) index  LDL cholesterol, direct  Result Value Ref Range   Direct LDL 151.0 mg/dL

## 2021-06-17 ENCOUNTER — Other Ambulatory Visit: Payer: Self-pay

## 2021-06-17 ENCOUNTER — Ambulatory Visit (INDEPENDENT_AMBULATORY_CARE_PROVIDER_SITE_OTHER): Payer: 59 | Admitting: Family Medicine

## 2021-06-17 ENCOUNTER — Encounter: Payer: Self-pay | Admitting: Family Medicine

## 2021-06-17 VITALS — BP 128/90 | HR 107 | Temp 97.9°F | Ht 61.0 in | Wt 168.8 lb

## 2021-06-17 DIAGNOSIS — R5383 Other fatigue: Secondary | ICD-10-CM | POA: Diagnosis not present

## 2021-06-17 DIAGNOSIS — Z130 Encounter for screening for diseases of the blood and blood-forming organs and certain disorders involving the immune mechanism: Secondary | ICD-10-CM

## 2021-06-17 DIAGNOSIS — Z23 Encounter for immunization: Secondary | ICD-10-CM

## 2021-06-17 DIAGNOSIS — E119 Type 2 diabetes mellitus without complications: Secondary | ICD-10-CM | POA: Diagnosis not present

## 2021-06-17 DIAGNOSIS — Z1329 Encounter for screening for other suspected endocrine disorder: Secondary | ICD-10-CM | POA: Diagnosis not present

## 2021-06-17 DIAGNOSIS — Z1211 Encounter for screening for malignant neoplasm of colon: Secondary | ICD-10-CM

## 2021-06-17 DIAGNOSIS — E785 Hyperlipidemia, unspecified: Secondary | ICD-10-CM | POA: Diagnosis not present

## 2021-06-17 DIAGNOSIS — Z20820 Contact with and (suspected) exposure to varicella: Secondary | ICD-10-CM

## 2021-06-17 MED ORDER — ROSUVASTATIN CALCIUM 10 MG PO TABS: 10.0000 mg | ORAL_TABLET | Freq: Every day | ORAL | 3 refills | Status: DC

## 2021-06-17 MED ORDER — GLIPIZIDE ER 10 MG PO TB24: 10.0000 mg | ORAL_TABLET | Freq: Every day | ORAL | 3 refills | Status: DC

## 2021-06-18 ENCOUNTER — Other Ambulatory Visit: Payer: Self-pay | Admitting: Family Medicine

## 2021-06-18 ENCOUNTER — Encounter: Payer: Self-pay | Admitting: Family Medicine

## 2021-06-18 DIAGNOSIS — E119 Type 2 diabetes mellitus without complications: Secondary | ICD-10-CM

## 2021-06-18 DIAGNOSIS — Z1231 Encounter for screening mammogram for malignant neoplasm of breast: Secondary | ICD-10-CM

## 2021-06-18 DIAGNOSIS — Z23 Encounter for immunization: Secondary | ICD-10-CM

## 2021-06-18 LAB — COMPREHENSIVE METABOLIC PANEL
ALT: 15 U/L (ref 0–35)
AST: 14 U/L (ref 0–37)
Albumin: 4.3 g/dL (ref 3.5–5.2)
Alkaline Phosphatase: 73 U/L (ref 39–117)
BUN: 12 mg/dL (ref 6–23)
CO2: 25 mEq/L (ref 19–32)
Calcium: 9.4 mg/dL (ref 8.4–10.5)
Chloride: 100 mEq/L (ref 96–112)
Creatinine, Ser: 0.65 mg/dL (ref 0.40–1.20)
GFR: 102.61 mL/min (ref >60.00)
Glucose, Bld: 176 mg/dL — ABNORMAL HIGH (ref 70–99)
Potassium: 4 mEq/L (ref 3.5–5.1)
Sodium: 134 mEq/L — ABNORMAL LOW (ref 135–145)
Total Bilirubin: 0.2 mg/dL (ref 0.2–1.2)
Total Protein: 7.5 g/dL (ref 6.0–8.3)

## 2021-06-18 LAB — LDL CHOLESTEROL, DIRECT: Direct LDL: 151 mg/dL

## 2021-06-18 LAB — LIPID PANEL
Cholesterol: 230 mg/dL — ABNORMAL HIGH (ref 0–200)
HDL: 54.7 mg/dL (ref >39.00)
NonHDL: 175.05
Total CHOL/HDL Ratio: 4
Triglycerides: 343 mg/dL — ABNORMAL HIGH (ref 0.0–149.0)
VLDL: 68.6 mg/dL — ABNORMAL HIGH (ref 0.0–40.0)

## 2021-06-18 LAB — CBC
HCT: 37.3 % (ref 36.0–46.0)
Hemoglobin: 12.6 g/dL (ref 12.0–15.0)
MCHC: 33.9 g/dL (ref 30.0–36.0)
MCV: 86.9 fl (ref 78.0–100.0)
Platelets: 307 10*3/uL (ref 150.0–400.0)
RBC: 4.29 Mil/uL (ref 3.87–5.11)
RDW: 13.7 % (ref 11.5–15.5)
WBC: 8 10*3/uL (ref 4.0–10.5)

## 2021-06-18 LAB — TSH: TSH: 1.59 u[IU]/mL (ref 0.35–5.50)

## 2021-06-18 LAB — VARICELLA ZOSTER ANTIBODY, IGG: Varicella IgG: <135 index — ABNORMAL LOW

## 2021-06-18 LAB — HEMOGLOBIN A1C: Hgb A1c MFr Bld: 7.8 % — ABNORMAL HIGH (ref 4.6–6.5)

## 2021-06-18 LAB — VITAMIN D 25 HYDROXY (VIT D DEFICIENCY, FRACTURES): VITD: 26.98 ng/mL — ABNORMAL LOW (ref 30.00–100.00)

## 2021-06-18 MED ORDER — EMPAGLIFLOZIN 10 MG PO TABS: 10.0000 mg | ORAL_TABLET | Freq: Every day | ORAL | 6 refills | Status: DC

## 2021-06-19 ENCOUNTER — Other Ambulatory Visit: Payer: Self-pay | Admitting: Family Medicine

## 2021-06-19 DIAGNOSIS — E119 Type 2 diabetes mellitus without complications: Secondary | ICD-10-CM

## 2021-06-20 ENCOUNTER — Telehealth: Payer: Self-pay | Admitting: Cardiology

## 2021-06-20 ENCOUNTER — Ambulatory Visit (INDEPENDENT_AMBULATORY_CARE_PROVIDER_SITE_OTHER): Payer: 59 | Admitting: Family Medicine

## 2021-06-20 ENCOUNTER — Other Ambulatory Visit: Payer: Self-pay

## 2021-06-20 ENCOUNTER — Encounter: Payer: Self-pay | Admitting: Family Medicine

## 2021-06-20 VITALS — BP 126/90 | HR 100 | Temp 97.5°F | Resp 17 | Ht 61.0 in | Wt 170.0 lb

## 2021-06-20 DIAGNOSIS — R0789 Other chest pain: Secondary | ICD-10-CM

## 2021-06-20 DIAGNOSIS — M79601 Pain in right arm: Secondary | ICD-10-CM

## 2021-06-20 NOTE — Progress Notes (Signed)
[facility] at [facility] [address] [place], [place] [address] [phone]  Date:  06/20/2021   Name:  Angela Carson   DOB:  April 11, 1971   MRN:  [id number]  PCP:  Pearline Cables, MD    Chief Complaint: Chest Pain (Tightness in right arm, chest, back, 2 days, shortness of breath)   History of Present Illness:  Angela Carson is a 50 y.o. very pleasant female patient who presents with the following:  Pt seen by myself recently for routine visit- 8/8 We gave prevnar 20- RIGHT arm  She contacted me last night as follows:  Good morning, is there any chance where I can see you today, August 11, anytime is fine. I have been feeling tight on my arms and pain on my back, I am not sure if it's because of the shot that I had. It will give me much comfort and peace of mind if you can check on me. And also I have only picked up the Glipizide and Rosuvastatin but not the Vitamin D and Jardiance  Since our last visit she noted  pain and tightness in her right arm/deltoid area, and a tender lymph node under the right arm She was worked Tuesday and Wednesday -she pushed through the pain, feels that perhaps this made her worse She also felt like her chest was not painful but tight.  Overall, she just got concerned and wanted to be seen  She took some advil this am and it did help-in fact, her symptoms are now resolved.  She is not currently having any chest pain or shortness of breath, her right arm pain is much better Patient Active Problem List   Diagnosis Date Noted   Controlled type 2 diabetes mellitus without complication, without long-term current use of insulin (HCC) 08/18/2019   Foot pain, bilateral 08/12/2017    Past Medical History:  Diagnosis Date   Multiple food allergies     No past surgical history on file.  Social History   Tobacco Use   Smoking status: Never   Smokeless tobacco: Never  Substance Use Topics    Alcohol use: No   Drug use: No    No family history on file.  Allergies  Allergen Reactions   Metformin And Related     Diarrhea, cannot tolerate    Medication list has been reviewed and updated.  Current Outpatient Medications on File Prior to Visit  Medication Sig Dispense Refill   glipiZIDE (GLUCOTROL XL) 10 MG 24 hr tablet Take 1 tablet (10 mg total) by mouth daily with breakfast. Increase to 10 mg as directed by MD 90 tablet 3   JARDIANCE 10 MG TABS tablet TAKE 1 TABLET BY MOUTH EVERY DAY BEFORE BREAKFAST 30 tablet 6   nystatin-triamcinolone ointment (MYCOLOG) Apply 1 application topically 2 (two) times daily. 30 g 0   rosuvastatin (CRESTOR) 10 MG tablet Take 1 tablet (10 mg total) by mouth daily. 90 tablet 3   sulfamethoxazole-trimethoprim (BACTRIM DS) 800-160 MG tablet Take 1 tablet by mouth 2 (two) times daily. 10 tablet 0   No current facility-administered medications on file prior to visit.    Review of Systems:  As per HPI- otherwise negative.   Physical Examination: Vitals:   06/20/21 1524  BP: 126/90  Pulse: 100  Resp: 17  Temp: (!) 97.5 F (36.4 C)  SpO2: 96%   Vitals:   06/20/21 1524  Weight: 170 lb (  77.1 kg)  Height: 5\' 1"  (1.549 m)   Body mass index is 32.12 kg/m. Ideal Body Weight: Weight in (lb) to have BMI = 25: 132  GEN: no acute distress.  Obese, otherwise looks well HEENT: Atraumatic, Normocephalic.  Ears and Nose: No external deformity. CV: RRR, No M/G/R. No JVD. No thrill. No extra heart sounds. PULM: CTA B, no wheezes, crackles, rhonchi. No retractions. No resp. distress. No accessory muscle use. ABD: S, NT, ND EXTR: No c/c/e PSYCH: Normally interactive. Conversant.  At this Time her right deltoid and underarm pain/tenderness are resolved  EKG: NSR, rate 96 Machine read as a flutter- I called to check with cardiology DOD-confirmed sinus rhythm Compared with previous EKG from 2016 no significant change noted  Pulse Readings from  Last 3 Encounters:  06/20/21 100  06/17/21 (!) 107  04/12/21 (!) 102    Assessment and Plan: Pain of right upper extremity - Plan: EKG 12-Lead  Chest tightness Patient seen today with concern of right arm pain and chest tightness over the last couple of days.  This is actually much improved at this time, in fact it is basically resolved.  It seems she was having tenderness and reactive lymphadenopathy from her Prevnar vaccine.  She took some Aleve and her shoulder feels much better.  EKG today is reassuring. Eri feels reassured contact me if any other concerns  This visit occurred during the SARS-CoV-2 public health emergency.  Safety protocols were in place, including screening questions prior to the visit, additional usage of staff PPE, and extensive cleaning of exam room while observing appropriate contact time as indicated for disinfecting solutions.    Signed Lawson Fiscal, MD

## 2021-06-20 NOTE — Telephone Encounter (Signed)
Called to review EKGF - computer read as A Fliutter -- is not Flutter - is NSR.   Bryan Lemma, MD

## 2021-06-26 MED ORDER — METFORMIN HCL ER 500 MG PO TB24: 500.0000 mg | ORAL_TABLET | Freq: Every day | ORAL | 3 refills | Status: DC

## 2021-06-26 NOTE — Addendum Note (Signed)
Addended by: Abbe Amsterdam C on: 06/26/2021 05:44 PM   Modules accepted: Orders

## 2021-06-27 ENCOUNTER — Other Ambulatory Visit: Payer: Self-pay

## 2021-06-27 ENCOUNTER — Ambulatory Visit (INDEPENDENT_AMBULATORY_CARE_PROVIDER_SITE_OTHER): Payer: 59

## 2021-06-27 ENCOUNTER — Telehealth: Payer: Self-pay | Admitting: *Deleted

## 2021-06-27 DIAGNOSIS — Z23 Encounter for immunization: Secondary | ICD-10-CM

## 2021-06-27 NOTE — Telephone Encounter (Signed)
Patient wanted to schedule an appointment for nurse visit for varicella vaccine.    Order:  "I ordered your varicella/ chicken pox vaccine for you.  You need 2 doses given  4-8 weeks apart."  Patient scheduled for today and also the second vaccine has been scheduled as well.  Patient needed afternoon appointment due to work schedule.

## 2021-06-27 NOTE — Progress Notes (Signed)
Pt here today for Varicella vaccine per Dr. Patsy Lager.   Varivax 0.63mL injected SUBQ into L arm. Pt tolerated injection well. VIS form given to Pt.    Next in 4-8 weeks.

## 2021-06-27 NOTE — Telephone Encounter (Signed)
Caller Phone Number [phone] Patient Name Angela Carson Patient DOB [birth year] Call Type Message Only Information Provided Reason for Call Request to Schedule Office Appointment Initial Comment Caller requesting nurse visit for vaccination, today. Patient request to speak to RN No Additional Comment Declined triage, provided office hours. Disp. Time Disposition Final User 06/27/2021 7:44:37 AM General Information Provided Yes Junius Creamer

## 2021-07-08 ENCOUNTER — Other Ambulatory Visit: Payer: Self-pay

## 2021-07-08 ENCOUNTER — Encounter (HOSPITAL_BASED_OUTPATIENT_CLINIC_OR_DEPARTMENT_OTHER): Payer: Self-pay

## 2021-07-08 ENCOUNTER — Ambulatory Visit (HOSPITAL_BASED_OUTPATIENT_CLINIC_OR_DEPARTMENT_OTHER)
Admission: RE | Admit: 2021-07-08 | Discharge: 2021-07-08 | Disposition: A | Discharge status: Home / Self Care | Payer: 59 | Source: Ambulatory Visit | Attending: Family Medicine | Admitting: Family Medicine

## 2021-07-08 DIAGNOSIS — Z1231 Encounter for screening mammogram for malignant neoplasm of breast: Secondary | ICD-10-CM

## 2021-07-17 LAB — COLOGUARD: Cologuard: NEGATIVE

## 2021-07-30 ENCOUNTER — Encounter: Payer: Self-pay | Admitting: Family Medicine

## 2021-07-30 DIAGNOSIS — E785 Hyperlipidemia, unspecified: Secondary | ICD-10-CM

## 2021-07-30 MED ORDER — ROSUVASTATIN CALCIUM 20 MG PO TABS: 20.0000 mg | ORAL_TABLET | Freq: Every day | ORAL | 3 refills | Status: DC

## 2021-07-30 NOTE — Telephone Encounter (Signed)
Pt has been called and I have informed her that we are able to send in the rx. Also If she wants the Booster she can get it at the phramacy down stairs. Then she inquired about the flu shot. I have informed her that she can get it, however if she wants to get that many vaccines in a day it is up to her. Pt reported understanding.

## 2021-08-01 ENCOUNTER — Other Ambulatory Visit: Payer: Self-pay

## 2021-08-01 ENCOUNTER — Ambulatory Visit (INDEPENDENT_AMBULATORY_CARE_PROVIDER_SITE_OTHER): Payer: 59

## 2021-08-01 DIAGNOSIS — Z23 Encounter for immunization: Secondary | ICD-10-CM | POA: Diagnosis not present

## 2021-08-01 NOTE — Progress Notes (Signed)
Angela Carson is a 50 y.o. female presents to the office today for Varicella vaccine, per physician's orders.  Viravax Varicella Virus Vaccine administered SQ in left arm.  Patient tolerated injection.  Alysia Penna, RN

## 2021-08-29 ENCOUNTER — Ambulatory Visit: Payer: 59 | Attending: Internal Medicine

## 2021-08-29 DIAGNOSIS — Z23 Encounter for immunization: Secondary | ICD-10-CM

## 2021-08-29 NOTE — Progress Notes (Signed)
   Covid-19 Vaccination Clinic  Name:  Angela Carson    MRN: [id number] DOB: 22-Jan-1971  08/29/2021  [name] was observed post Covid-19 immunization for 15 minutes without incident. She was provided with Vaccine Information Sheet and instruction to access the V-Safe system.   [name] was instructed to call 911 with any severe reactions post vaccine: Difficulty breathing  Swelling of face and throat  A fast heartbeat  A bad rash all over body  Dizziness and weakness   Immunizations Administered     Name Date Dose VIS Date Route   Moderna Covid-19 vaccine Bivalent Booster 08/29/2021  2:45 PM 0.5 mL 06/22/2021 Intramuscular   Manufacturer: Moderna   Lot: [id number]   NDC: 19417-408-14

## 2021-09-24 ENCOUNTER — Other Ambulatory Visit (HOSPITAL_BASED_OUTPATIENT_CLINIC_OR_DEPARTMENT_OTHER): Payer: Self-pay

## 2021-09-24 MED ORDER — MODERNA COVID-19 BIVAL BOOSTER 50 MCG/0.5ML IM SUSP
INTRAMUSCULAR | 0 refills | Status: DC
  Filled 2021-09-24: qty 0.5, 1d supply, fill #0

## 2021-10-28 ENCOUNTER — Other Ambulatory Visit: Payer: Self-pay | Admitting: Family Medicine

## 2021-10-28 DIAGNOSIS — E119 Type 2 diabetes mellitus without complications: Secondary | ICD-10-CM

## 2021-11-20 ENCOUNTER — Other Ambulatory Visit: Payer: Self-pay | Admitting: Family Medicine

## 2021-11-20 DIAGNOSIS — E119 Type 2 diabetes mellitus without complications: Secondary | ICD-10-CM

## 2021-12-16 ENCOUNTER — Other Ambulatory Visit: Payer: Self-pay | Admitting: Family Medicine

## 2021-12-16 DIAGNOSIS — E119 Type 2 diabetes mellitus without complications: Secondary | ICD-10-CM

## 2022-01-14 ENCOUNTER — Other Ambulatory Visit: Payer: Self-pay | Admitting: Family Medicine

## 2022-01-14 DIAGNOSIS — E119 Type 2 diabetes mellitus without complications: Secondary | ICD-10-CM

## 2022-02-11 ENCOUNTER — Other Ambulatory Visit: Payer: Self-pay | Admitting: Family Medicine

## 2022-02-11 DIAGNOSIS — E119 Type 2 diabetes mellitus without complications: Secondary | ICD-10-CM

## 2022-02-20 NOTE — Progress Notes (Addendum)
[facility] at [facility] ?[address], [address] ?[place], [place] [address] ?336 [phone] ?Fax [phone] ? ?Date:  02/24/2022  ? ?Name:  Angela Carson   DOB:  [birth year]   MRN:  [id number] ? ?PCP:  Jaquavis Felmlee, Gay Filler, MD  ? ? ?Chief Complaint: Follow-up (Concerns/ questions: none/Eye exam: none recent) ? ? ?History of Present Illness: ? ?Angela Carson is a 51 y.o. very pleasant female patient who presents with the following: ? ?Pt seen today for follow-up ?Last seen by myself in August ?History of DM ? ?Mammo UTD  ?Pap UTD  ?Colon cancer screening- cologuard last year  ?Shingirx- pt did not have chicken pox and was vaccinated for same  ?Eye exam- needs to schedule  ?Urine microalbumin- update today  ?Labs done in August - she is not fasting today  ?Crestor, metformin, glipizide  ? ?She is concerned her A1c may be high - she is not taking her medication all that consistently ?She tends to forget due to being busy ?She is trying to walk for exercise  ? ?Lab Results  ?Component Value Date  ? HGBA1C 7.8 (H) 06/17/2021  ? ? ? ?Patient Active Problem List  ? Diagnosis Date Noted  ? Controlled type 2 diabetes mellitus without complication, without long-term current use of insulin ([place]) 08/18/2019  ? Foot pain, bilateral 08/12/2017  ? ? ?Past Medical History:  ?Diagnosis Date  ? Multiple food allergies   ? ? ?No past surgical history on file. ? ?Social History  ? ?Tobacco Use  ? Smoking status: Never  ? Smokeless tobacco: Never  ?Substance Use Topics  ? Alcohol use: No  ? Drug use: No  ? ? ?No family history on file. ? ?Allergies  ?Allergen Reactions  ? Metformin And Related   ?  Diarrhea, cannot tolerate  ? ? ?Medication list has been reviewed and updated. ? ?Current Outpatient Medications on File Prior to Visit  ?Medication Sig Dispense Refill  ? glipiZIDE (GLUCOTROL XL) 10 MG 24 hr tablet Take 1 tablet (10 mg total) by mouth daily with breakfast. Increase to 10 mg as directed by MD 90 tablet 3   ? metFORMIN (GLUCOPHAGE-XR) 500 MG 24 hr tablet TAKE 1 TABLET (500 MG TOTAL) BY MOUTH DAILY. INCREASE TO 1000 MG IF TOLERATED 60 tablet 0  ? nystatin-triamcinolone ointment (MYCOLOG) Apply 1 application topically 2 (two) times daily. 30 g 0  ? rosuvastatin (CRESTOR) 20 MG tablet Take 1 tablet (20 mg total) by mouth daily. 90 tablet 3  ? ?No current facility-administered medications on file prior to visit.  ? ? ?Review of Systems: ? ?As per HPI- otherwise negative. ? ?Pulse Readings from Last 3 Encounters:  ?02/24/22 (!) 103  ?06/20/21 100  ?06/17/21 (!) 107  ? ? ? ?Physical Examination: ?Vitals:  ? 02/24/22 1510  ?BP: 132/80  ?Pulse: (!) 103  ?Resp: 18  ?Temp: 98.1 ?F (36.7 ?C)  ?SpO2: 95%  ? ?Vitals:  ? 02/24/22 1510  ?Weight: 166 lb 9.6 oz (75.6 kg)  ?Height: 5' (1.524 m)  ? ?Body mass index is 32.54 kg/m?. ?Ideal Body Weight: Weight in (lb) to have BMI = 25: 127.7 ? ?GEN: no acute distress.  Obese, looks well  ?HEENT: Atraumatic, Normocephalic. Bilateral TM wnl, oropharynx normal.  PEERL,EOMI.   ?Nasal cavity discharge and congestion is present  ?Ears and Nose: No external deformity. ?CV: RRR, No M/G/R. No JVD. No thrill. No extra heart sounds. ?PULM: CTA B, no wheezes,  crackles, rhonchi. No retractions. No resp. distress. No accessory muscle use. ?ABD: S, NT, ND, +BS. No rebound. No HSM. ?EXTR: No c/c/e ?PSYCH: Normally interactive. Conversant.  ? ? ?Assessment and Plan: ?Dyslipidemia - Plan: Lipid panel ? ?Controlled type 2 diabetes mellitus without complication, without long-term current use of insulin ([place]) - Plan: Basic metabolic panel, Hemoglobin A1c, Microalbumin / creatinine urine ratio ? ?Screening for deficiency anemia - Plan: CBC ? ?Screening for thyroid disorder - Plan: TSH ? ?Acute non-recurrent frontal sinusitis - Plan: amoxicillin (AMOXIL) 500 MG capsule ? ?Patient seen today for follow-up.  Blood pressures under good control ?Admits she is often forgetting to take her diabetes medications.  We  will check A1c today and then follow-up ?Other routine labs pending as above ?She notes symptoms of possible allergies with runny nose and eyes, sneezing and facial pressure.  She continues to have headaches and frontal sinus pressure for the last 2 weeks.  We will treat with amoxicillin for a sinus infection, I have asked her to let me know if this does not resolve her headaches-sooner if worse ? ?Signed ?Lamar Blinks, MD ? ?Addendum 4/18, received labs as below.  Message to patient ? ?Results for orders placed or performed in visit on 02/24/22  ?CBC  ?Result Value Ref Range  ? WBC 8.0 4.0 - 10.5 K/uL  ? RBC 4.30 3.87 - 5.11 Mil/uL  ? Platelets 307.0 150.0 - 400.0 K/uL  ? Hemoglobin 12.8 12.0 - 15.0 g/dL  ? HCT 37.7 36.0 - 46.0 %  ? MCV 87.8 78.0 - 100.0 fl  ? MCHC 33.9 30.0 - 36.0 g/dL  ? RDW 13.4 11.5 - 15.5 %  ?Basic metabolic panel  ?Result Value Ref Range  ? Sodium 135 135 - 145 mEq/L  ? Potassium 4.2 3.5 - 5.1 mEq/L  ? Chloride 96 96 - 112 mEq/L  ? CO2 29 19 - 32 mEq/L  ? Glucose, Bld 208 (H) 70 - 99 mg/dL  ? BUN 15 6 - 23 mg/dL  ? Creatinine, Ser 0.77 0.40 - 1.20 mg/dL  ? GFR 89.46 >60.00 mL/min  ? Calcium 10.5 8.4 - 10.5 mg/dL  ?Lipid panel  ?Result Value Ref Range  ? Cholesterol 193 0 - 200 mg/dL  ? Triglycerides 271.0 (H) 0.0 - 149.0 mg/dL  ? HDL 56.80 >39.00 mg/dL  ? VLDL 54.2 (H) 0.0 - 40.0 mg/dL  ? Total CHOL/HDL Ratio 3   ? NonHDL 136.06   ?Hemoglobin A1c  ?Result Value Ref Range  ? Hgb A1c MFr Bld 8.4 (H) 4.6 - 6.5 %  ?TSH  ?Result Value Ref Range  ? TSH 1.60 0.35 - 5.50 uIU/mL  ?Microalbumin / creatinine urine ratio  ?Result Value Ref Range  ? Microalb, Ur 6.3 (H) 0.0 - 1.9 mg/dL  ? Creatinine,U 76.9 mg/dL  ? Microalb Creat Ratio 8.2 0.0 - 30.0 mg/g  ?LDL cholesterol, direct  ?Result Value Ref Range  ? Direct LDL 119.0 mg/dL  ? ? ?

## 2022-02-24 ENCOUNTER — Ambulatory Visit (INDEPENDENT_AMBULATORY_CARE_PROVIDER_SITE_OTHER): Payer: 59 | Admitting: Family Medicine

## 2022-02-24 VITALS — BP 132/80 | HR 103 | Temp 98.1°F | Resp 18 | Ht 60.0 in | Wt 166.6 lb

## 2022-02-24 DIAGNOSIS — Z1329 Encounter for screening for other suspected endocrine disorder: Secondary | ICD-10-CM

## 2022-02-24 DIAGNOSIS — E119 Type 2 diabetes mellitus without complications: Secondary | ICD-10-CM

## 2022-02-24 DIAGNOSIS — E785 Hyperlipidemia, unspecified: Secondary | ICD-10-CM

## 2022-02-24 DIAGNOSIS — J0110 Acute frontal sinusitis, unspecified: Secondary | ICD-10-CM

## 2022-02-24 DIAGNOSIS — Z130 Encounter for screening for diseases of the blood and blood-forming organs and certain disorders involving the immune mechanism: Secondary | ICD-10-CM | POA: Diagnosis not present

## 2022-02-24 MED ORDER — AMOXICILLIN 500 MG PO CAPS
1000.0000 mg | ORAL_CAPSULE | Freq: Two times a day (BID) | ORAL | 0 refills | Status: DC
Start: 2022-02-24 — End: 2022-08-08

## 2022-02-24 NOTE — Patient Instructions (Signed)
Good to see you - I will be in touch with your labs ?Your headaches and sinus pressure may be due to a sinus infection ?Try the amoxicillin for 10 days ?If this fails to resolve your headache symptoms please let me know!    ?

## 2022-02-25 ENCOUNTER — Encounter: Payer: Self-pay | Admitting: Family Medicine

## 2022-02-25 LAB — MICROALBUMIN / CREATININE URINE RATIO
Creatinine,U: 76.9 mg/dL
Microalb Creat Ratio: 8.2 mg/g (ref 0.0–30.0)
Microalb, Ur: 6.3 mg/dL — ABNORMAL HIGH (ref 0.0–1.9)

## 2022-02-25 LAB — CBC
HCT: 37.7 % (ref 36.0–46.0)
Hemoglobin: 12.8 g/dL (ref 12.0–15.0)
MCHC: 33.9 g/dL (ref 30.0–36.0)
MCV: 87.8 fl (ref 78.0–100.0)
Platelets: 307 10*3/uL (ref 150.0–400.0)
RBC: 4.3 Mil/uL (ref 3.87–5.11)
RDW: 13.4 % (ref 11.5–15.5)
WBC: 8 10*3/uL (ref 4.0–10.5)

## 2022-02-25 LAB — BASIC METABOLIC PANEL
BUN: 15 mg/dL (ref 6–23)
CO2: 29 mEq/L (ref 19–32)
Calcium: 10.5 mg/dL (ref 8.4–10.5)
Chloride: 96 mEq/L (ref 96–112)
Creatinine, Ser: 0.77 mg/dL (ref 0.40–1.20)
GFR: 89.46 mL/min (ref >60.00)
Glucose, Bld: 208 mg/dL — ABNORMAL HIGH (ref 70–99)
Potassium: 4.2 mEq/L (ref 3.5–5.1)
Sodium: 135 mEq/L (ref 135–145)

## 2022-02-25 LAB — TSH: TSH: 1.6 u[IU]/mL (ref 0.35–5.50)

## 2022-02-25 LAB — LDL CHOLESTEROL, DIRECT: Direct LDL: 119 mg/dL

## 2022-02-25 LAB — LIPID PANEL
Cholesterol: 193 mg/dL (ref 0–200)
HDL: 56.8 mg/dL (ref >39.00)
NonHDL: 136.06
Total CHOL/HDL Ratio: 3
Triglycerides: 271 mg/dL — ABNORMAL HIGH (ref 0.0–149.0)
VLDL: 54.2 mg/dL — ABNORMAL HIGH (ref 0.0–40.0)

## 2022-02-25 LAB — HEMOGLOBIN A1C: Hgb A1c MFr Bld: 8.4 % — ABNORMAL HIGH (ref 4.6–6.5)

## 2022-03-11 ENCOUNTER — Other Ambulatory Visit: Payer: Self-pay | Admitting: Family Medicine

## 2022-03-11 DIAGNOSIS — E119 Type 2 diabetes mellitus without complications: Secondary | ICD-10-CM

## 2022-03-19 ENCOUNTER — Encounter: Payer: Self-pay | Admitting: Family Medicine

## 2022-03-20 ENCOUNTER — Other Ambulatory Visit: Payer: Self-pay | Admitting: Family Medicine

## 2022-03-20 DIAGNOSIS — E119 Type 2 diabetes mellitus without complications: Secondary | ICD-10-CM

## 2022-03-20 MED ORDER — OZEMPIC (0.25 OR 0.5 MG/DOSE) 2 MG/1.5ML ~~LOC~~ SOPN: PEN_INJECTOR | SUBCUTANEOUS | 2 refills | Status: DC

## 2022-03-25 NOTE — Progress Notes (Deleted)
[place] at [facility] [address], [place], [place] [address] [phone]  Date:  03/27/2022   Name:  Dosha Naramore   DOB:  1970-11-27   MRN:  [id number]  PCP:  Darreld Mclean, MD    Chief Complaint: No chief complaint on file.   History of Present Illness:  Shanay Meyn is a 51 y.o. very pleasant female patient who presents with the following:  Patient seen today with concern of joint pain History of diabetes, most recent visit with myself was just last month A1c 8.4% at that time-we got her started on Ozempic in addition to her metformin and glipizide Patient Active Problem List   Diagnosis Date Noted   Controlled type 2 diabetes mellitus without complication, without long-term current use of insulin ([place]) 08/18/2019   Foot pain, bilateral 08/12/2017    Past Medical History:  Diagnosis Date   Multiple food allergies     No past surgical history on file.  Social History   Tobacco Use   Smoking status: Never   Smokeless tobacco: Never  Substance Use Topics   Alcohol use: No   Drug use: No    No family history on file.  Allergies  Allergen Reactions   Metformin And Related     Diarrhea, cannot tolerate    Medication list has been reviewed and updated.  Current Outpatient Medications on File Prior to Visit  Medication Sig Dispense Refill   amoxicillin (AMOXIL) 500 MG capsule Take 2 capsules (1,000 mg total) by mouth 2 (two) times daily. 40 capsule 0   glipiZIDE (GLUCOTROL XL) 10 MG 24 hr tablet Take 1 tablet (10 mg total) by mouth daily with breakfast. Increase to 10 mg as directed by MD 90 tablet 3   metFORMIN (GLUCOPHAGE-XR) 500 MG 24 hr tablet TAKE 1 TABLET (500 MG TOTAL) BY MOUTH DAILY. INCREASE TO 1000 MG IF TOLERATED 60 tablet 0   nystatin-triamcinolone ointment (MYCOLOG) Apply 1 application topically 2 (two) times daily. 30 g 0   rosuvastatin (CRESTOR) 20 MG tablet Take 1 tablet (20 mg total)  by mouth daily. 90 tablet 3   Semaglutide,0.25 or 0.5MG /DOS, (OZEMPIC, 0.25 OR 0.5 MG/DOSE,) 2 MG/1.5ML SOPN Inject 0.25 mg [place] weekly for 4 weeks, then go to 0.5 mg weekly 1.5 mL 2   No current facility-administered medications on file prior to visit.    Review of Systems:  As per HPI- otherwise negative.   Physical Examination: There were no vitals filed for this visit. There were no vitals filed for this visit. There is no height or weight on file to calculate BMI. Ideal Body Weight:    GEN: no acute distress. HEENT: Atraumatic, Normocephalic.  Ears and Nose: No external deformity. CV: RRR, No M/G/R. No JVD. No thrill. No extra heart sounds. PULM: CTA B, no wheezes, crackles, rhonchi. No retractions. No resp. distress. No accessory muscle use. ABD: S, NT, ND, +BS. No rebound. No HSM. EXTR: No c/c/e PSYCH: Normally interactive. Conversant.    Assessment and Plan: ***  Signed Lamar Blinks, MD

## 2022-03-27 ENCOUNTER — Ambulatory Visit: Payer: 59 | Admitting: Family Medicine

## 2022-03-28 LAB — HM DIABETES EYE EXAM

## 2022-04-09 ENCOUNTER — Ambulatory Visit
Admission: RE | Admit: 2022-04-09 | Discharge: 2022-04-09 | Disposition: A | Discharge status: Home / Self Care | Payer: 59 | Source: Ambulatory Visit | Attending: Urgent Care | Admitting: Urgent Care

## 2022-04-09 ENCOUNTER — Ambulatory Visit: Payer: 59

## 2022-04-09 ENCOUNTER — Ambulatory Visit (INDEPENDENT_AMBULATORY_CARE_PROVIDER_SITE_OTHER): Payer: 59

## 2022-04-09 VITALS — BP 159/84 | HR 80 | Temp 98.0°F | Resp 20

## 2022-04-09 DIAGNOSIS — M545 Low back pain, unspecified: Secondary | ICD-10-CM

## 2022-04-09 DIAGNOSIS — M25562 Pain in left knee: Secondary | ICD-10-CM

## 2022-04-09 MED ORDER — MELOXICAM 7.5 MG PO TABS: 7.5000 mg | ORAL_TABLET | Freq: Every day | ORAL | 0 refills | Status: DC

## 2022-04-09 NOTE — ED Provider Notes (Signed)
[facility]   MRN: [id number] DOB: [birth year]  Subjective:   Angela Carson is a 51 y.o. female presenting for 3-week history of persistent bilateral knee pain worse on the left.  She is also started to have low back pain.  Patient has been doing a lot of walking.  She has also been doing a lot of work doing cleaning, has to squat a lot.  Has used ibuprofen intermittently but not consistently.  She has type 2 diabetes and is treated without insulin but is not well controlled as her A1c has been greater than 8%.  No history of arthritis or musculoskeletal disorders.  No kidney disease.  No current facility-administered medications for this encounter.  Current Outpatient Medications:    amoxicillin (AMOXIL) 500 MG capsule, Take 2 capsules (1,000 mg total) by mouth 2 (two) times daily., Disp: 40 capsule, Rfl: 0   glipiZIDE (GLUCOTROL XL) 10 MG 24 hr tablet, Take 1 tablet (10 mg total) by mouth daily with breakfast. Increase to 10 mg as directed by MD, Disp: 90 tablet, Rfl: 3   metFORMIN (GLUCOPHAGE-XR) 500 MG 24 hr tablet, TAKE 1 TABLET (500 MG TOTAL) BY MOUTH DAILY. INCREASE TO 1000 MG IF TOLERATED, Disp: 60 tablet, Rfl: 0   nystatin-triamcinolone ointment (MYCOLOG), Apply 1 application topically 2 (two) times daily., Disp: 30 g, Rfl: 0   rosuvastatin (CRESTOR) 20 MG tablet, Take 1 tablet (20 mg total) by mouth daily., Disp: 90 tablet, Rfl: 3   Semaglutide,0.25 or 0.5MG /DOS, (OZEMPIC, 0.25 OR 0.5 MG/DOSE,) 2 MG/1.5ML SOPN, Inject 0.25 mg [place] weekly for 4 weeks, then go to 0.5 mg weekly, Disp: 1.5 mL, Rfl: 2   Allergies  Allergen Reactions   Metformin And Related     Diarrhea, cannot tolerate    Past Medical History:  Diagnosis Date   Multiple food allergies      History reviewed. No pertinent surgical history.  History reviewed. No pertinent family history.  Social History   Tobacco Use   Smoking status: Never   Smokeless tobacco: Never  Substance Use  Topics   Alcohol use: No   Drug use: No    ROS   Objective:   Vitals: BP (!) 159/84 (BP Location: Right Arm)   Pulse 80   Temp 98 F (36.7 C) (Oral)   Resp 20   SpO2 97%   Physical Exam Constitutional:      General: She is not in acute distress.    Appearance: Normal appearance. She is well-developed. She is obese. She is not ill-appearing, toxic-appearing or diaphoretic.  HENT:     Head: Normocephalic and atraumatic.     Nose: Nose normal.     Mouth/Throat:     Mouth: Mucous membranes are moist.  Eyes:     General: No scleral icterus.       Right eye: No discharge.        Left eye: No discharge.     Extraocular Movements: Extraocular movements intact.  Cardiovascular:     Rate and Rhythm: Normal rate.  Pulmonary:     Effort: Pulmonary effort is normal.  Musculoskeletal:     Left knee: Swelling (trace) and bony tenderness present. No deformity, effusion, erythema, ecchymosis, lacerations or crepitus. Normal range of motion. Tenderness present over the medial joint line, lateral joint line and patellar tendon. Normal alignment and normal patellar mobility.  Skin:    General: Skin is warm and dry.  Neurological:     General: No  focal deficit present.     Mental Status: She is alert and oriented to person, place, and time.  Psychiatric:        Mood and Affect: Mood normal.        Behavior: Behavior normal.    DG Knee Complete 4 Views Left  Result Date: 04/09/2022 CLINICAL DATA:  Left knee pain EXAM: LEFT KNEE - COMPLETE 4+ VIEW COMPARISON:  None Available. FINDINGS: No fracture or dislocation is seen. The joint spaces are preserved. The visualized soft tissues are unremarkable. No suprapatellar knee joint effusion. IMPRESSION: Negative. Electronically Signed   By: Charline Bills M.D.   On: 04/09/2022 18:46    Assessment and Plan :   PDMP not reviewed this encounter.  1. Acute pain of left knee   2. 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  An Ace wrap was applied to the left knee.  Recommended meloxicam.  We will avoid steroid use given her diabetes. Counseled patient on potential for adverse effects with medications prescribed/recommended today, ER and return-to-clinic precautions discussed, patient verbalized understanding.    Wallis Bamberg, [place] 04/09/22 [id number]

## 2022-04-09 NOTE — ED Triage Notes (Signed)
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

## 2022-04-09 NOTE — ED Notes (Signed)
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    7/10 pain

## 2022-04-11 ENCOUNTER — Other Ambulatory Visit: Payer: Self-pay | Admitting: Family Medicine

## 2022-04-11 DIAGNOSIS — E119 Type 2 diabetes mellitus without complications: Secondary | ICD-10-CM

## 2022-04-19 MED ORDER — OZEMPIC (0.25 OR 0.5 MG/DOSE) 2 MG/3ML ~~LOC~~ SOPN: 0.5000 mg | PEN_INJECTOR | SUBCUTANEOUS | 2 refills | Status: DC

## 2022-04-19 NOTE — Addendum Note (Signed)
Addended by: Abbe Amsterdam C on: 04/19/2022 05:16 AM   Modules accepted: Orders

## 2022-05-08 ENCOUNTER — Ambulatory Visit: Payer: 59 | Admitting: Family Medicine

## 2022-05-10 ENCOUNTER — Other Ambulatory Visit: Payer: Self-pay | Admitting: Family Medicine

## 2022-05-10 DIAGNOSIS — E119 Type 2 diabetes mellitus without complications: Secondary | ICD-10-CM

## 2022-06-05 ENCOUNTER — Encounter: Payer: Self-pay | Admitting: Family Medicine

## 2022-06-05 MED ORDER — MELOXICAM 7.5 MG PO TABS: 7.5000 mg | ORAL_TABLET | Freq: Every day | ORAL | 0 refills | Status: DC

## 2022-06-05 MED ORDER — OZEMPIC (0.25 OR 0.5 MG/DOSE) 2 MG/3ML ~~LOC~~ SOPN: 0.5000 mg | PEN_INJECTOR | SUBCUTANEOUS | 2 refills | Status: DC

## 2022-06-11 ENCOUNTER — Other Ambulatory Visit: Payer: Self-pay | Admitting: Family Medicine

## 2022-06-11 DIAGNOSIS — E119 Type 2 diabetes mellitus without complications: Secondary | ICD-10-CM

## 2022-06-15 ENCOUNTER — Other Ambulatory Visit: Payer: Self-pay | Admitting: Family Medicine

## 2022-06-15 DIAGNOSIS — E119 Type 2 diabetes mellitus without complications: Secondary | ICD-10-CM

## 2022-07-03 ENCOUNTER — Other Ambulatory Visit: Payer: Self-pay | Admitting: Family Medicine

## 2022-07-08 ENCOUNTER — Other Ambulatory Visit: Payer: Self-pay | Admitting: Family Medicine

## 2022-07-08 DIAGNOSIS — E119 Type 2 diabetes mellitus without complications: Secondary | ICD-10-CM

## 2022-07-12 ENCOUNTER — Other Ambulatory Visit: Payer: Self-pay | Admitting: Family Medicine

## 2022-07-12 DIAGNOSIS — E785 Hyperlipidemia, unspecified: Secondary | ICD-10-CM

## 2022-08-06 ENCOUNTER — Other Ambulatory Visit: Payer: Self-pay | Admitting: Family Medicine

## 2022-08-06 DIAGNOSIS — E785 Hyperlipidemia, unspecified: Secondary | ICD-10-CM

## 2022-08-06 DIAGNOSIS — E119 Type 2 diabetes mellitus without complications: Secondary | ICD-10-CM

## 2022-08-08 NOTE — Patient Instructions (Incomplete)
It was good to see you again today, I will be in touch with the results of soon as possible Recommend COVID shot this fall We will increase your ozempic to 1 mg to assist with weight loss It looks like you are due for a mammogram- please set up asap

## 2022-08-08 NOTE — Progress Notes (Unsigned)
[place] [address] [place], [place] [address] [phone]  Date:  08/11/2022   Name:  Angela Carson   DOB:  November 08, 1971   MRN:  [id number]  PCP:  Darreld Mclean, MD    Chief Complaint: No chief complaint on file.   History of Present Illness:  Angela Carson is a 51 y.o. very pleasant female patient who presents with the following:  Patient seen today for physical exam Most recent visit with myself was in April of this year for sick visit/sinusitis  History of diabetes, dyslipidemia  Glipizide and daily Metformin 500 mg daily Ozempic 0.5 Crestor  Colon cancer screening Shingrix Flu shot Foot exam is due Can update A1c Lab Results  Component Value Date   HGBA1C 8.4 (H) 02/24/2022    Patient Active Problem List   Diagnosis Date Noted   Controlled type 2 diabetes mellitus without complication, without long-term current use of insulin ([place]) 08/18/2019   Foot pain, bilateral 08/12/2017    Past Medical History:  Diagnosis Date   Multiple food allergies     No past surgical history on file.  Social History   Tobacco Use   Smoking status: Never   Smokeless tobacco: Never  Substance Use Topics   Alcohol use: No   Drug use: No    No family history on file.  Allergies  Allergen Reactions   Metformin And Related     Diarrhea, cannot tolerate    Medication list has been reviewed and updated.  Current Outpatient Medications on File Prior to Visit  Medication Sig Dispense Refill   amoxicillin (AMOXIL) 500 MG capsule Take 2 capsules (1,000 mg total) by mouth 2 (two) times daily. 40 capsule 0   glipiZIDE (GLUCOTROL XL) 10 MG 24 hr tablet TAKE 1 TABLET (10 MG TOTAL) BY MOUTH DAILY WITH BREAKFAST. 30 tablet 0   meloxicam (MOBIC) 7.5 MG tablet TAKE 1 TABLET BY MOUTH EVERY DAY 30 tablet 0   metFORMIN (GLUCOPHAGE-XR) 500 MG 24 hr tablet TAKE 1 TABLET BY MOUTH EVERY DAY WITH BREAKFAST 30  tablet 1   nystatin-triamcinolone ointment (MYCOLOG) Apply 1 application topically 2 (two) times daily. 30 g 0   rosuvastatin (CRESTOR) 20 MG tablet TAKE 1 TABLET BY MOUTH EVERY DAY 30 tablet 0   Semaglutide,0.25 or 0.5MG /DOS, (OZEMPIC, 0.25 OR 0.5 MG/DOSE,) 2 MG/3ML SOPN Inject 0.5 mg into the skin once a week. 3 mL 2   No current facility-administered medications on file prior to visit.    Review of Systems:  As per HPI- otherwise negative.   Physical Examination: There were no vitals filed for this visit. There were no vitals filed for this visit. There is no height or weight on file to calculate BMI. Ideal Body Weight:    GEN: no acute distress. HEENT: Atraumatic, Normocephalic.  Ears and Nose: No external deformity. CV: RRR, No M/G/R. No JVD. No thrill. No extra heart sounds. PULM: CTA B, no wheezes, crackles, rhonchi. No retractions. No resp. distress. No accessory muscle use. ABD: S, NT, ND, +BS. No rebound. No HSM. EXTR: No c/c/e PSYCH: Normally interactive. Conversant.    Assessment and Plan: *** Physical exam today.  Encouraged healthy diet and exercise routine Will plan further follow- up pending labs.  Signed Lamar Blinks, MD

## 2022-08-11 ENCOUNTER — Ambulatory Visit (INDEPENDENT_AMBULATORY_CARE_PROVIDER_SITE_OTHER): Payer: 59 | Admitting: Family Medicine

## 2022-08-11 ENCOUNTER — Other Ambulatory Visit: Payer: Self-pay | Admitting: Family Medicine

## 2022-08-11 ENCOUNTER — Encounter: Payer: Self-pay | Admitting: Family Medicine

## 2022-08-11 VITALS — BP 122/80 | HR 88 | Temp 97.6°F | Resp 18 | Ht 60.0 in | Wt 167.6 lb

## 2022-08-11 DIAGNOSIS — Z23 Encounter for immunization: Secondary | ICD-10-CM | POA: Diagnosis not present

## 2022-08-11 DIAGNOSIS — Z5181 Encounter for therapeutic drug level monitoring: Secondary | ICD-10-CM

## 2022-08-11 DIAGNOSIS — E119 Type 2 diabetes mellitus without complications: Secondary | ICD-10-CM

## 2022-08-11 DIAGNOSIS — M25561 Pain in right knee: Secondary | ICD-10-CM | POA: Diagnosis not present

## 2022-08-11 DIAGNOSIS — E785 Hyperlipidemia, unspecified: Secondary | ICD-10-CM

## 2022-08-11 DIAGNOSIS — R5383 Other fatigue: Secondary | ICD-10-CM | POA: Diagnosis not present

## 2022-08-11 DIAGNOSIS — G8929 Other chronic pain: Secondary | ICD-10-CM

## 2022-08-11 DIAGNOSIS — Z0000 Encounter for general adult medical examination without abnormal findings: Secondary | ICD-10-CM | POA: Diagnosis not present

## 2022-08-11 DIAGNOSIS — Z1231 Encounter for screening mammogram for malignant neoplasm of breast: Secondary | ICD-10-CM

## 2022-08-11 MED ORDER — MELOXICAM 7.5 MG PO TABS: 7.5000 mg | ORAL_TABLET | Freq: Every day | ORAL | 1 refills | Status: DC

## 2022-08-11 MED ORDER — SEMAGLUTIDE (1 MG/DOSE) 4 MG/3ML ~~LOC~~ SOPN: 1.0000 mg | PEN_INJECTOR | SUBCUTANEOUS | 2 refills | Status: DC

## 2022-08-12 ENCOUNTER — Encounter: Payer: Self-pay | Admitting: Family Medicine

## 2022-08-12 LAB — CBC
HCT: 34.7 % — ABNORMAL LOW (ref 36.0–46.0)
Hemoglobin: 11.8 g/dL — ABNORMAL LOW (ref 12.0–15.0)
MCHC: 34 g/dL (ref 30.0–36.0)
MCV: 86 fl (ref 78.0–100.0)
Platelets: 296 10*3/uL (ref 150.0–400.0)
RBC: 4.03 Mil/uL (ref 3.87–5.11)
RDW: 13.5 % (ref 11.5–15.5)
WBC: 8.2 10*3/uL (ref 4.0–10.5)

## 2022-08-12 LAB — HEMOGLOBIN A1C: Hgb A1c MFr Bld: 7.4 % — ABNORMAL HIGH (ref 4.6–6.5)

## 2022-08-12 LAB — COMPREHENSIVE METABOLIC PANEL
ALT: 17 U/L (ref 0–35)
AST: 15 U/L (ref 0–37)
Albumin: 4.2 g/dL (ref 3.5–5.2)
Alkaline Phosphatase: 69 U/L (ref 39–117)
BUN: 11 mg/dL (ref 6–23)
CO2: 29 mEq/L (ref 19–32)
Calcium: 9.5 mg/dL (ref 8.4–10.5)
Chloride: 103 mEq/L (ref 96–112)
Creatinine, Ser: 0.61 mg/dL (ref 0.40–1.20)
GFR: 103.35 mL/min (ref >60.00)
Glucose, Bld: 104 mg/dL — ABNORMAL HIGH (ref 70–99)
Potassium: 4 mEq/L (ref 3.5–5.1)
Sodium: 140 mEq/L (ref 135–145)
Total Bilirubin: 0.3 mg/dL (ref 0.2–1.2)
Total Protein: 7.2 g/dL (ref 6.0–8.3)

## 2022-08-12 LAB — TSH: TSH: 1.55 u[IU]/mL (ref 0.35–5.50)

## 2022-08-18 ENCOUNTER — Inpatient Hospital Stay (HOSPITAL_BASED_OUTPATIENT_CLINIC_OR_DEPARTMENT_OTHER): Admission: RE | Admit: 2022-08-18 | Payer: 59 | Source: Ambulatory Visit

## 2022-08-25 ENCOUNTER — Other Ambulatory Visit: Payer: Self-pay | Admitting: Family Medicine

## 2022-08-27 ENCOUNTER — Encounter (HOSPITAL_BASED_OUTPATIENT_CLINIC_OR_DEPARTMENT_OTHER): Payer: Self-pay

## 2022-08-27 ENCOUNTER — Ambulatory Visit (HOSPITAL_BASED_OUTPATIENT_CLINIC_OR_DEPARTMENT_OTHER)
Admission: RE | Admit: 2022-08-27 | Discharge: 2022-08-27 | Disposition: A | Discharge status: Home / Self Care | Payer: 59 | Source: Ambulatory Visit | Attending: Family Medicine | Admitting: Family Medicine

## 2022-08-27 DIAGNOSIS — Z1231 Encounter for screening mammogram for malignant neoplasm of breast: Secondary | ICD-10-CM | POA: Diagnosis present

## 2022-09-01 ENCOUNTER — Other Ambulatory Visit: Payer: Self-pay | Admitting: Family Medicine

## 2022-09-01 DIAGNOSIS — E119 Type 2 diabetes mellitus without complications: Secondary | ICD-10-CM

## 2022-09-02 ENCOUNTER — Other Ambulatory Visit: Payer: Self-pay | Admitting: Family Medicine

## 2022-09-02 DIAGNOSIS — E785 Hyperlipidemia, unspecified: Secondary | ICD-10-CM

## 2022-09-29 ENCOUNTER — Telehealth: Payer: Self-pay | Admitting: Family Medicine

## 2022-09-29 DIAGNOSIS — E119 Type 2 diabetes mellitus without complications: Secondary | ICD-10-CM

## 2022-09-29 NOTE — Telephone Encounter (Signed)
Patient states her pharmacy is out of ozempic and would like to know what her next step is. Please advise.

## 2022-09-30 ENCOUNTER — Other Ambulatory Visit (HOSPITAL_BASED_OUTPATIENT_CLINIC_OR_DEPARTMENT_OTHER): Payer: Self-pay

## 2022-09-30 ENCOUNTER — Encounter: Payer: Self-pay | Admitting: Family Medicine

## 2022-09-30 MED ORDER — SEMAGLUTIDE (1 MG/DOSE) 4 MG/3ML ~~LOC~~ SOPN
1.0000 mg | PEN_INJECTOR | SUBCUTANEOUS | 2 refills | Status: DC
  Filled 2022-09-30: qty 3, 28d supply, fill #0
  Filled 2022-10-24: qty 3, 28d supply, fill #1
  Filled 2022-11-21: qty 3, 30d supply, fill #2
  Filled 2022-11-28: qty 3, 28d supply, fill #2
  Filled 2022-12-22: qty 3, 28d supply, fill #3
  Filled 2023-01-21: qty 3, 28d supply, fill #4
  Filled 2023-02-20: qty 3, 28d supply, fill #5

## 2022-09-30 NOTE — Telephone Encounter (Signed)
We only have 2 mg.

## 2022-09-30 NOTE — Telephone Encounter (Signed)
Would you like her to look around, or prescribe another Rx?

## 2022-10-27 ENCOUNTER — Encounter: Payer: Self-pay | Admitting: Family Medicine

## 2022-10-27 NOTE — Telephone Encounter (Signed)
Patient, no answer.  Left message on machine.  If she was having chest pain or pressure this should not wait.  I am afraid I am not able to see her today as my schedule is full.  Would recommend seeking care at emergency room or urgent care Will also reply to her MyChart message

## 2022-11-21 ENCOUNTER — Other Ambulatory Visit (HOSPITAL_BASED_OUTPATIENT_CLINIC_OR_DEPARTMENT_OTHER): Payer: Self-pay

## 2022-11-24 ENCOUNTER — Telehealth: Payer: Self-pay

## 2022-11-24 ENCOUNTER — Other Ambulatory Visit (HOSPITAL_BASED_OUTPATIENT_CLINIC_OR_DEPARTMENT_OTHER): Payer: Self-pay

## 2022-11-24 NOTE — Telephone Encounter (Signed)
PA initiated via Covermymeds; KEY: [id number]. Awaiting determination.

## 2022-11-24 NOTE — Telephone Encounter (Signed)
PA denied. Plan requires OV notes. Last OV note faxed to [employer] at [phone]. Awaiting determination.

## 2022-11-25 ENCOUNTER — Other Ambulatory Visit (HOSPITAL_BASED_OUTPATIENT_CLINIC_OR_DEPARTMENT_OTHER): Payer: Self-pay

## 2022-11-27 ENCOUNTER — Other Ambulatory Visit (HOSPITAL_BASED_OUTPATIENT_CLINIC_OR_DEPARTMENT_OTHER): Payer: Self-pay

## 2022-11-27 ENCOUNTER — Other Ambulatory Visit: Payer: Self-pay

## 2022-11-28 ENCOUNTER — Telehealth: Payer: Self-pay | Admitting: Family Medicine

## 2022-11-28 ENCOUNTER — Other Ambulatory Visit (HOSPITAL_BASED_OUTPATIENT_CLINIC_OR_DEPARTMENT_OTHER): Payer: Self-pay

## 2022-11-28 ENCOUNTER — Encounter: Payer: Self-pay | Admitting: Family Medicine

## 2022-11-28 NOTE — Telephone Encounter (Signed)
Pt called stating that she has changed insurances and needs a PA done for her Ozempic in order to continue taking it. Insurance is up to date on chart.

## 2022-11-28 NOTE — Telephone Encounter (Signed)
Patient is aware that a PA was sent for her ozempic but she would like to know what she should do regarding not being able to take her medication. Please advise.

## 2022-11-28 NOTE — Telephone Encounter (Signed)
Called to check insurance and they did not receive the notes that we sent.  I have resent notes to [phone] to [facility] as an urgent request.

## 2022-11-28 NOTE — Telephone Encounter (Signed)
Called to check insurance and they did not receive the notes that we sent.  I have resent notes to [phone] to [employer] [facility] as an urgent request.  What should pt do in the meantime?

## 2022-12-01 ENCOUNTER — Other Ambulatory Visit (HOSPITAL_BASED_OUTPATIENT_CLINIC_OR_DEPARTMENT_OTHER): Payer: Self-pay

## 2022-12-01 NOTE — Telephone Encounter (Signed)
Called to check insurance and they did not receive the notes that we sent.  I have resent notes to [phone] to [employer] [facility] as an urgent request. 

## 2022-12-01 NOTE — Telephone Encounter (Signed)
Prior auth approved through 11/29/2023.

## 2022-12-05 ENCOUNTER — Other Ambulatory Visit (HOSPITAL_BASED_OUTPATIENT_CLINIC_OR_DEPARTMENT_OTHER): Payer: Self-pay

## 2022-12-05 MED ORDER — COMIRNATY 30 MCG/0.3ML IM SUSY
PREFILLED_SYRINGE | INTRAMUSCULAR | 0 refills | Status: DC
  Filled 2022-12-05: qty 0.3, 1d supply, fill #0

## 2022-12-22 ENCOUNTER — Other Ambulatory Visit (HOSPITAL_BASED_OUTPATIENT_CLINIC_OR_DEPARTMENT_OTHER): Payer: Self-pay

## 2023-01-28 ENCOUNTER — Other Ambulatory Visit: Payer: Self-pay | Admitting: Family Medicine

## 2023-01-28 DIAGNOSIS — G8929 Other chronic pain: Secondary | ICD-10-CM

## 2023-02-07 IMAGING — DX DG KNEE COMPLETE 4+V*L*
4 series · 4 of 4 positions shown · non-contrast
Comparison: None Available.

CLINICAL DATA: Left knee pain

EXAM:
LEFT KNEE - COMPLETE 4+ VIEW

[knee ap]
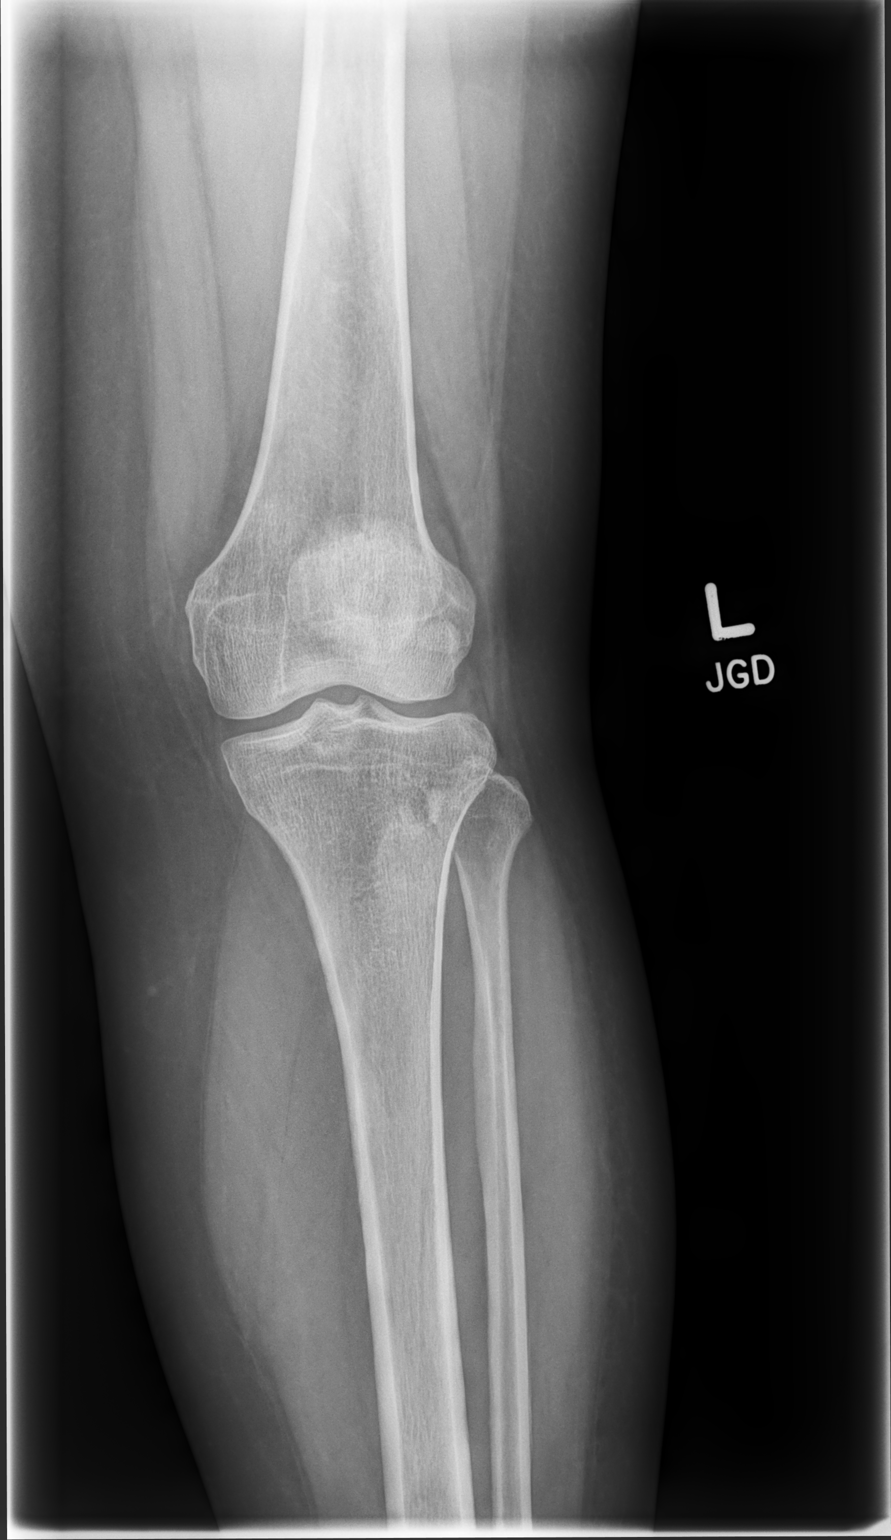

[knee lat]
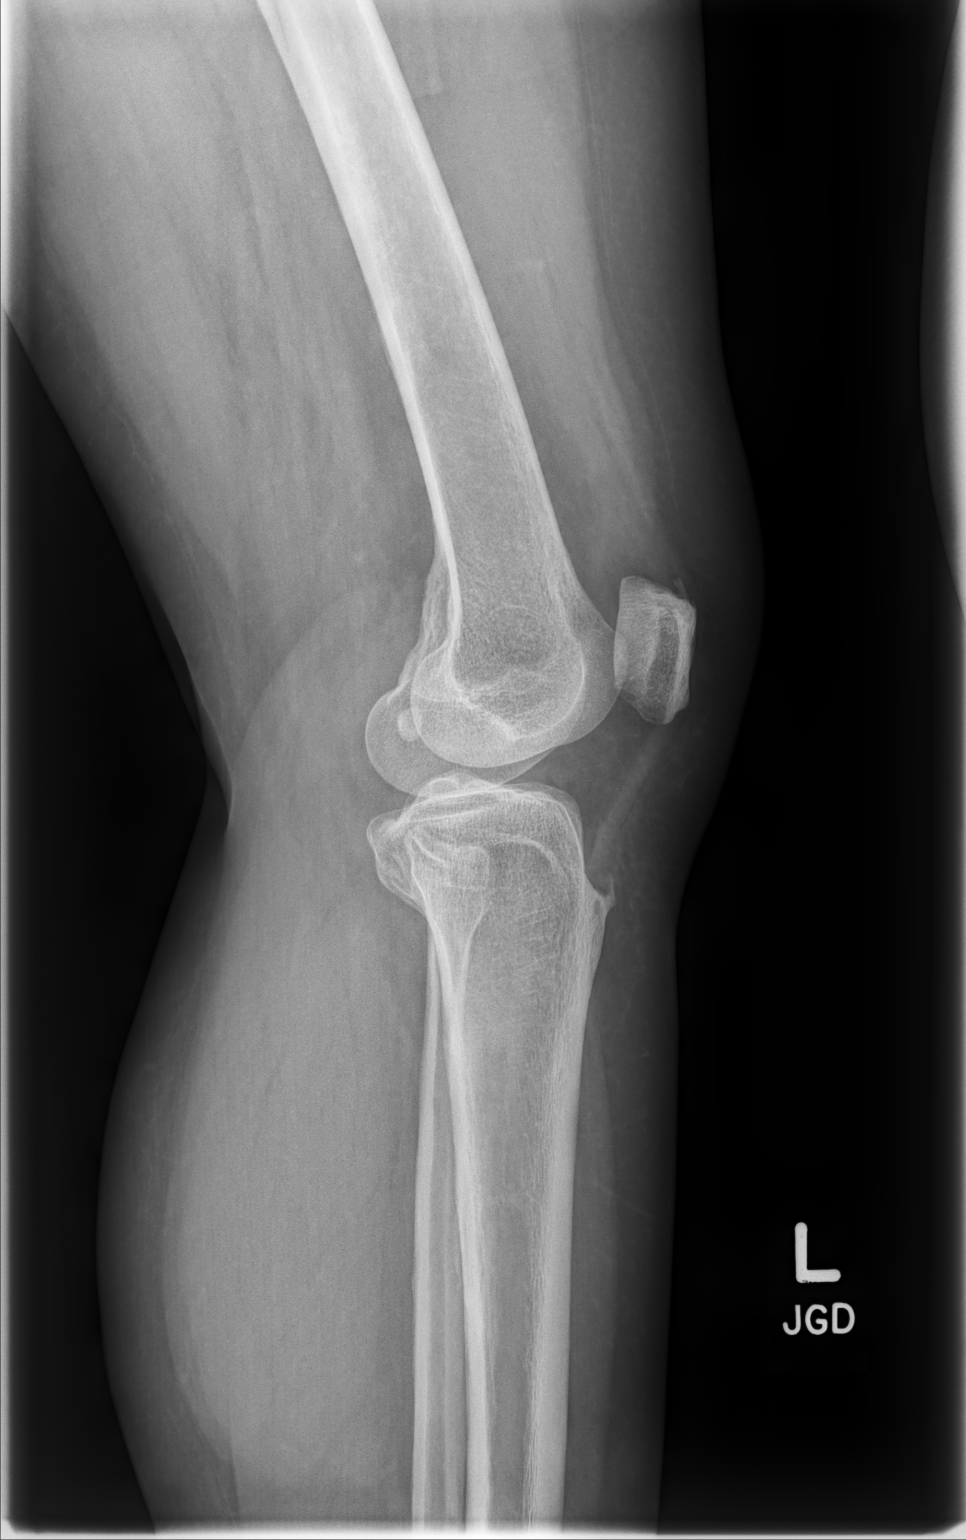

[knee oblique (1 of 2)]
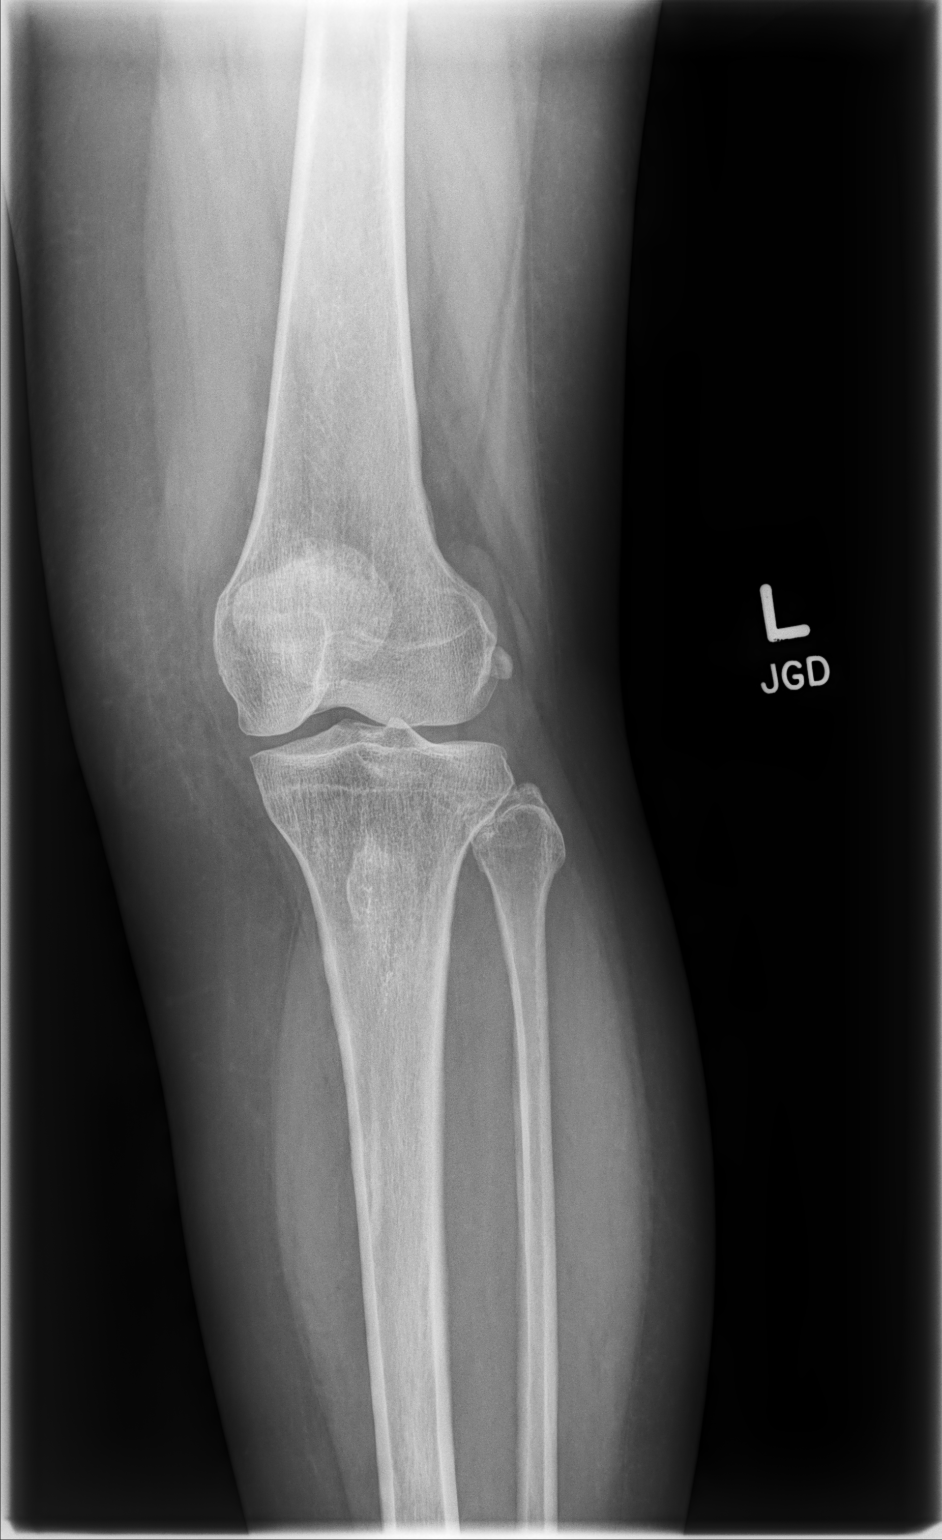

[knee oblique (2 of 2)]
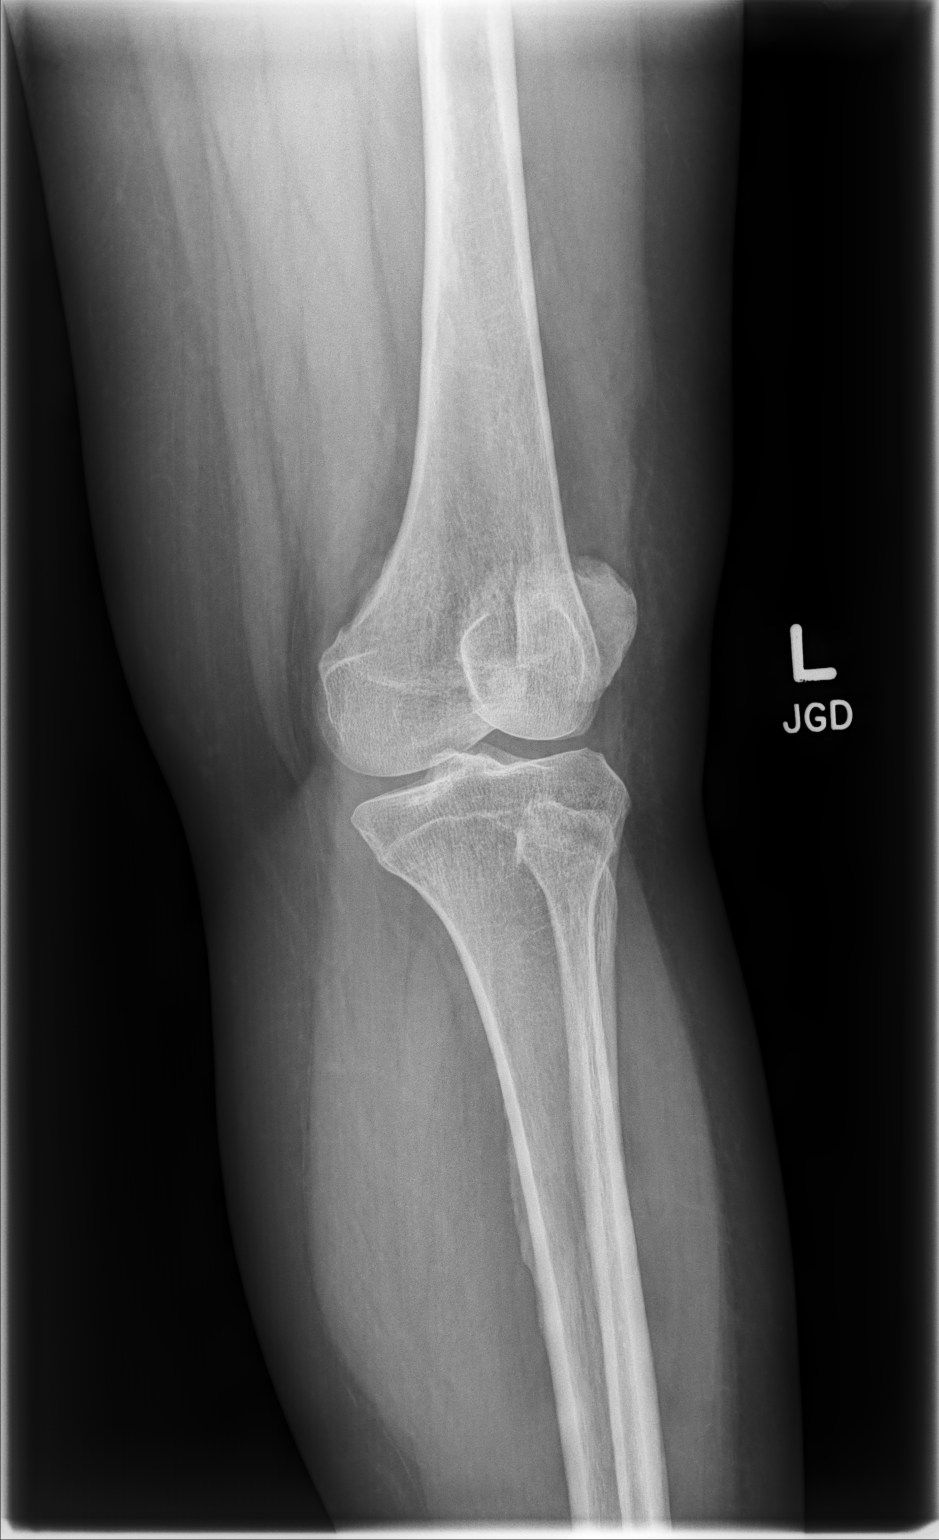

[4 of 4 positions shown; findings below may reference images not displayed]

FINDINGS: No fracture or dislocation is seen.

The joint spaces are preserved.

The visualized soft tissues are unremarkable.

No suprapatellar knee joint effusion.
IMPRESSION: Negative.

## 2023-02-24 DIAGNOSIS — E785 Hyperlipidemia, unspecified: Secondary | ICD-10-CM | POA: Insufficient documentation

## 2023-02-24 NOTE — Patient Instructions (Incomplete)
It was good to see you again today, I will be in touch with your labs ASAP Recommend COVID booster if not done in the last 6 to 9 months  Your EKG looks good.  However, lets go ahead and get a coronary calcium score as well.  This will give [place] a bit more information about your heart health.  Stop by imaging on the ground floor and they can schedule this for you  We will change you from Ozempic to [facility] for additional weight loss benefit.  Started 7.5 mg of Mounjaro once a week, I can increase you to the next highest strength in about 4 weeks if desired.  Let me know when you are ready to go up

## 2023-02-24 NOTE — Progress Notes (Addendum)
[facility] [address] [place], [place] [address] [phone]  Date:  02/26/2023   Name:  Angela Carson   DOB:  21-Oct-1971   MRN:  [id number]  PCP:  Pearline Cables, MD    Chief Complaint: Follow-up (Concerns/ questions: heartburn that is come and go- no tx tried yet)   History of Present Illness:  Angela Carson is a 52 y.o. very pleasant female patient who presents with the following:  Patient seen today for follow-up, would like to discuss possible heartburn History of diabetes under reasonable control, dyslipidemia, vitamin D deficiency Most recent visit with myself was in October  We have titrated her Ozempic up to 1 mg- she notes no major SE but she is not losing weight either  We discussed potentially changing to [facility] for additional weight loss benefit and she would like to try this Also taking Crestor, metformin, glipizide  Labs done October including CMP, CBC, A1c, TSH Lab Results  Component Value Date   HGBA1C 7.4 (H) 08/11/2022   Can offer CT coronary calcium-she is interested Colon cancer screening- cologuard is UTD  Shingrix- she had the chicken pox vaccine instead as she was not varicella immune Pap smear- offered to do for her today, but will do another day  Urine microalbumin is due Mammo is UTD  Today she notes she may have a heavy feeling in her chest on occasion May occur once a week or so, has noticed intermittently for 6 months  It tends to occur during the day as opposed to at night, not related to eating generally but she may notice it when she wakes up in the am Might last 10 minutes or so, will resolve on it's own  No CP or SOB with exercise  She enjoys walking for exercising, notes she is doing quite a bit of walking  Wt Readings from Last 3 Encounters:  02/26/23 163 lb (73.9 kg)  08/11/22 167 lb 9.6 oz (76 kg)  02/24/22 166 lb 9.6 oz (75.6 kg)   She did do biometric  testing at her office recently and she notes her A1c was 6.1% per her recollection.  She would like to go ahead and do lab work today as well  Patient Active Problem List   Diagnosis Date Noted   Dyslipidemia 02/24/2023   Controlled type 2 diabetes mellitus without complication, without long-term current use of insulin 08/18/2019   Foot pain, bilateral 08/12/2017    Past Medical History:  Diagnosis Date   Multiple food allergies     No past surgical history on file.  Social History   Tobacco Use   Smoking status: Never   Smokeless tobacco: Never  Substance Use Topics   Alcohol use: No   Drug use: No    No family history on file.  Allergies  Allergen Reactions   Metformin And Related     Diarrhea, cannot tolerate    Medication list has been reviewed and updated.  Current Outpatient Medications on File Prior to Visit  Medication Sig Dispense Refill   glipiZIDE (GLUCOTROL XL) 10 MG 24 hr tablet TAKE 1 TABLET (10 MG TOTAL) BY MOUTH DAILY WITH BREAKFAST. 90 tablet 1   meloxicam (MOBIC) 7.5 MG tablet TAKE 1 TABLET (7.5 MG TOTAL) BY MOUTH DAILY. USE AS NEEDED FOR KNEE AND FINGER PAIN 30 tablet 5   metFORMIN (GLUCOPHAGE-XR) 500 MG 24 hr tablet TAKE 1 TABLET BY MOUTH  EVERY DAY WITH BREAKFAST 90 tablet 1   nystatin-triamcinolone ointment (MYCOLOG) Apply 1 application topically 2 (two) times daily. 30 g 0   rosuvastatin (CRESTOR) 20 MG tablet TAKE 1 TABLET BY MOUTH EVERY DAY 90 tablet 1   No current facility-administered medications on file prior to visit.    Review of Systems:  As per HPI- otherwise negative.   Physical Examination: Vitals:   02/26/23 1537  BP: 122/80  Pulse: 88  Resp: 18  Temp: 97.8 F (36.6 C)  SpO2: 96%   Vitals:   02/26/23 1537  Weight: 163 lb (73.9 kg)  Height: 5' (1.524 m)   Body mass index is 31.83 kg/m. Ideal Body Weight: Weight in (lb) to have BMI = 25: 127.7  GEN: no acute distress.  Mildly obese, looks well HEENT: Atraumatic,  Normocephalic. Bilateral TM wnl, oropharynx normal.  PEERL,EOMI.   Ears and Nose: No external deformity. CV: RRR, No M/G/R. No JVD. No thrill. No extra heart sounds. PULM: CTA B, no wheezes, crackles, rhonchi. No retractions. No resp. distress. No accessory muscle use. ABD: S, NT, ND, +BS. No rebound. No HSM. EXTR: No c/c/e PSYCH: Normally interactive. Conversant.   EKG-normal sinus rhythm, rate 84-no concerning findings Assessment and Plan: Dyslipidemia - Plan: Lipid panel, CT CARDIAC SCORING (SELF PAY ONLY)  Controlled type 2 diabetes mellitus without complication, without long-term current use of insulin - Plan: Basic metabolic panel, Hemoglobin A1c, Microalbumin / creatinine urine ratio, CT CARDIAC SCORING (SELF PAY ONLY), tirzepatide (MOUNJARO) 7.5 MG/0.5ML Pen  Fatigue, unspecified type - Plan: VITAMIN D 25 Hydroxy (Vit-D Deficiency, Fractures)  Screening for deficiency anemia - Plan: CBC  Vitamin D deficiency - Plan: VITAMIN D 25 Hydroxy (Vit-D Deficiency, Fractures)  Chest heaviness - Plan: EKG 12-Lead  Patient seen today for follow-up.  She reports a very good recent A1c  through her job.  We will confirm this today She is having trouble losing weight, will be interested in trying Mounjaro instead of Ozempic.  Will have her switch over to [facility] at 7.5 mg.  Advised we can go up on her doses monthly as needed and tolerated Follow-up on her other lab work as above Chest heaviness-atypical, doubt coronary disease.  However given her risk factors a coronary calcium may be helpful.  She would like to get this done, ordered for her today She will let me know if any worsening or changing of her symptoms in the meantime Signed Abbe Amsterdam, MD  Received labs 4/19- message to pt  Results for orders placed or performed in visit on 02/26/23  Basic metabolic panel  Result Value Ref Range   Sodium 138 135 - 145 mEq/L   Potassium 4.2 3.5 - 5.1 mEq/L   Chloride 101 96 - 112 mEq/L    CO2 28 19 - 32 mEq/L   Glucose, Bld 77 70 - 99 mg/dL   BUN 8 6 - 23 mg/dL   Creatinine, Ser 0.98 0.40 - 1.20 mg/dL   GFR 119.14 >78.29 mL/min   Calcium 9.6 8.4 - 10.5 mg/dL  CBC  Result Value Ref Range   WBC 7.4 4.0 - 10.5 K/uL   RBC 4.40 3.87 - 5.11 Mil/uL   Platelets 294.0 150.0 - 400.0 K/uL   Hemoglobin 12.8 12.0 - 15.0 g/dL   HCT 56.2 13.0 - 86.5 %   MCV 86.6 78.0 - 100.0 fl   MCHC 33.7 30.0 - 36.0 g/dL   RDW 78.4 69.6 - 29.5 %  Hemoglobin A1c  Result Value  Ref Range   Hgb A1c MFr Bld 6.8 (H) 4.6 - 6.5 %  Lipid panel  Result Value Ref Range   Cholesterol 150 0 - 200 mg/dL   Triglycerides 161.0 (H) 0.0 - 149.0 mg/dL   HDL 96.04 >54.09 mg/dL   VLDL 81.1 0.0 - 91.4 mg/dL   LDL Cholesterol 56 0 - 99 mg/dL   Total CHOL/HDL Ratio 2    NonHDL 87.98   VITAMIN D 25 Hydroxy (Vit-D Deficiency, Fractures)  Result Value Ref Range   VITD 39.00 30.00 - 100.00 ng/mL

## 2023-02-26 ENCOUNTER — Ambulatory Visit (INDEPENDENT_AMBULATORY_CARE_PROVIDER_SITE_OTHER): Payer: 59 | Admitting: Family Medicine

## 2023-02-26 ENCOUNTER — Other Ambulatory Visit (HOSPITAL_BASED_OUTPATIENT_CLINIC_OR_DEPARTMENT_OTHER): Payer: Self-pay

## 2023-02-26 VITALS — BP 122/80 | HR 88 | Temp 97.8°F | Resp 18 | Ht 60.0 in | Wt 163.0 lb

## 2023-02-26 DIAGNOSIS — R5383 Other fatigue: Secondary | ICD-10-CM | POA: Diagnosis not present

## 2023-02-26 DIAGNOSIS — R0789 Other chest pain: Secondary | ICD-10-CM

## 2023-02-26 DIAGNOSIS — E559 Vitamin D deficiency, unspecified: Secondary | ICD-10-CM

## 2023-02-26 DIAGNOSIS — E119 Type 2 diabetes mellitus without complications: Secondary | ICD-10-CM

## 2023-02-26 DIAGNOSIS — E785 Hyperlipidemia, unspecified: Secondary | ICD-10-CM | POA: Diagnosis not present

## 2023-02-26 DIAGNOSIS — Z130 Encounter for screening for diseases of the blood and blood-forming organs and certain disorders involving the immune mechanism: Secondary | ICD-10-CM

## 2023-02-26 MED ORDER — TIRZEPATIDE 7.5 MG/0.5ML ~~LOC~~ SOAJ
7.5000 mg | SUBCUTANEOUS | 0 refills | Status: DC
  Filled 2023-02-26 – 2023-03-18 (×2): qty 2, 28d supply, fill #0

## 2023-02-27 ENCOUNTER — Encounter: Payer: Self-pay | Admitting: Family Medicine

## 2023-02-27 ENCOUNTER — Other Ambulatory Visit (INDEPENDENT_AMBULATORY_CARE_PROVIDER_SITE_OTHER): Payer: 59

## 2023-02-27 ENCOUNTER — Other Ambulatory Visit: Payer: Self-pay | Admitting: Family Medicine

## 2023-02-27 DIAGNOSIS — E119 Type 2 diabetes mellitus without complications: Secondary | ICD-10-CM

## 2023-02-27 DIAGNOSIS — E785 Hyperlipidemia, unspecified: Secondary | ICD-10-CM

## 2023-02-27 LAB — LIPID PANEL
Cholesterol: 150 mg/dL (ref 0–200)
HDL: 62.5 mg/dL (ref >39.00)
LDL Cholesterol: 56 mg/dL (ref 0–99)
NonHDL: 87.98
Total CHOL/HDL Ratio: 2
Triglycerides: 160 mg/dL — ABNORMAL HIGH (ref 0.0–149.0)
VLDL: 32 mg/dL (ref 0.0–40.0)

## 2023-02-27 LAB — BASIC METABOLIC PANEL
BUN: 8 mg/dL (ref 6–23)
CO2: 28 mEq/L (ref 19–32)
Calcium: 9.6 mg/dL (ref 8.4–10.5)
Chloride: 101 mEq/L (ref 96–112)
Creatinine, Ser: 0.6 mg/dL (ref 0.40–1.20)
GFR: 103.37 mL/min (ref >60.00)
Glucose, Bld: 77 mg/dL (ref 70–99)
Potassium: 4.2 mEq/L (ref 3.5–5.1)
Sodium: 138 mEq/L (ref 135–145)

## 2023-02-27 LAB — CBC
HCT: 38.1 % (ref 36.0–46.0)
Hemoglobin: 12.8 g/dL (ref 12.0–15.0)
MCHC: 33.7 g/dL (ref 30.0–36.0)
MCV: 86.6 fl (ref 78.0–100.0)
Platelets: 294 10*3/uL (ref 150.0–400.0)
RBC: 4.4 Mil/uL (ref 3.87–5.11)
RDW: 13.7 % (ref 11.5–15.5)
WBC: 7.4 10*3/uL (ref 4.0–10.5)

## 2023-02-27 LAB — VITAMIN D 25 HYDROXY (VIT D DEFICIENCY, FRACTURES): VITD: 39 ng/mL (ref 30.00–100.00)

## 2023-02-27 LAB — MICROALBUMIN / CREATININE URINE RATIO
Creatinine,U: 79.7 mg/dL
Microalb Creat Ratio: 6.5 mg/g (ref 0.0–30.0)
Microalb, Ur: 5.2 mg/dL — ABNORMAL HIGH (ref 0.0–1.9)

## 2023-02-27 LAB — HEMOGLOBIN A1C: Hgb A1c MFr Bld: 6.8 % — ABNORMAL HIGH (ref 4.6–6.5)

## 2023-03-12 ENCOUNTER — Other Ambulatory Visit (HOSPITAL_BASED_OUTPATIENT_CLINIC_OR_DEPARTMENT_OTHER): Payer: 59

## 2023-03-19 ENCOUNTER — Other Ambulatory Visit (HOSPITAL_BASED_OUTPATIENT_CLINIC_OR_DEPARTMENT_OTHER): Payer: Self-pay

## 2023-03-19 ENCOUNTER — Encounter: Payer: Self-pay | Admitting: Family Medicine

## 2023-03-19 ENCOUNTER — Other Ambulatory Visit (HOSPITAL_COMMUNITY): Payer: Self-pay

## 2023-03-19 NOTE — Telephone Encounter (Signed)
Do you have any suggestions? 

## 2023-04-03 LAB — HM DIABETES EYE EXAM

## 2023-04-14 ENCOUNTER — Other Ambulatory Visit: Payer: Self-pay | Admitting: Family Medicine

## 2023-04-14 DIAGNOSIS — E119 Type 2 diabetes mellitus without complications: Secondary | ICD-10-CM

## 2023-04-15 ENCOUNTER — Encounter: Payer: Self-pay | Admitting: Family Medicine

## 2023-04-15 ENCOUNTER — Other Ambulatory Visit (HOSPITAL_BASED_OUTPATIENT_CLINIC_OR_DEPARTMENT_OTHER): Payer: Self-pay

## 2023-04-15 DIAGNOSIS — E119 Type 2 diabetes mellitus without complications: Secondary | ICD-10-CM

## 2023-04-15 MED ORDER — TIRZEPATIDE 10 MG/0.5ML ~~LOC~~ SOAJ: 10.0000 mg | SUBCUTANEOUS | 1 refills | Status: DC

## 2023-04-16 ENCOUNTER — Other Ambulatory Visit (HOSPITAL_BASED_OUTPATIENT_CLINIC_OR_DEPARTMENT_OTHER): Payer: Self-pay

## 2023-04-16 MED ORDER — TIRZEPATIDE 10 MG/0.5ML ~~LOC~~ SOAJ
10.0000 mg | SUBCUTANEOUS | 1 refills | Status: DC
  Filled 2023-04-16: qty 6, 84d supply, fill #0

## 2023-04-16 NOTE — Addendum Note (Signed)
Addended by: Abbe Amsterdam C on: 04/16/2023 05:13 PM   Modules accepted: Orders

## 2023-04-20 ENCOUNTER — Other Ambulatory Visit: Payer: Self-pay | Admitting: Family Medicine

## 2023-04-20 ENCOUNTER — Other Ambulatory Visit (HOSPITAL_BASED_OUTPATIENT_CLINIC_OR_DEPARTMENT_OTHER): Payer: Self-pay

## 2023-04-20 DIAGNOSIS — E119 Type 2 diabetes mellitus without complications: Secondary | ICD-10-CM

## 2023-04-21 ENCOUNTER — Other Ambulatory Visit (HOSPITAL_BASED_OUTPATIENT_CLINIC_OR_DEPARTMENT_OTHER): Payer: Self-pay

## 2023-04-21 NOTE — Telephone Encounter (Signed)
Mounjaro 10mg  is on back order, patient is requesting 7.5mg  to continue therapy until supply issue is resolved. Thanks!

## 2023-04-27 MED ORDER — TIRZEPATIDE 7.5 MG/0.5ML ~~LOC~~ SOAJ: 7.5000 mg | SUBCUTANEOUS | 1 refills | Status: DC

## 2023-04-27 MED ORDER — TIRZEPATIDE 10 MG/0.5ML ~~LOC~~ SOAJ: 10.0000 mg | SUBCUTANEOUS | 1 refills | Status: DC

## 2023-04-27 NOTE — Addendum Note (Signed)
Addended by: Abbe Amsterdam C on: 04/27/2023 09:52 AM   Modules accepted: Orders

## 2023-04-27 NOTE — Addendum Note (Signed)
Addended bySilvio Pate on: 04/27/2023 10:39 AM   Modules accepted: Orders

## 2023-04-27 NOTE — Telephone Encounter (Signed)
Looks like 10 mg was sent to [facility]. Pended for pharmacy requested. Sign if appropriate.

## 2023-04-27 NOTE — Addendum Note (Signed)
Addended by: Abbe Amsterdam C on: 04/27/2023 11:22 AM   Modules accepted: Orders

## 2023-04-28 ENCOUNTER — Other Ambulatory Visit (HOSPITAL_BASED_OUTPATIENT_CLINIC_OR_DEPARTMENT_OTHER): Payer: Self-pay

## 2023-04-28 MED ORDER — TIRZEPATIDE 12.5 MG/0.5ML ~~LOC~~ SOAJ
12.5000 mg | SUBCUTANEOUS | 0 refills | Status: DC
  Filled 2023-04-28 – 2023-05-21 (×2): qty 2, 28d supply, fill #0

## 2023-04-28 NOTE — Addendum Note (Signed)
Addended byConrad Bridgehampton D on: 04/28/2023 01:14 PM   Modules accepted: Orders

## 2023-05-18 ENCOUNTER — Other Ambulatory Visit (HOSPITAL_BASED_OUTPATIENT_CLINIC_OR_DEPARTMENT_OTHER): Payer: Self-pay

## 2023-05-18 MED ORDER — TIRZEPATIDE 12.5 MG/0.5ML ~~LOC~~ SOAJ
12.5000 mg | SUBCUTANEOUS | 0 refills | Status: DC
  Filled 2023-05-18: qty 2, 28d supply, fill #0

## 2023-05-18 NOTE — Addendum Note (Signed)
Addended byConrad Mesa D on: 05/18/2023 03:18 PM   Modules accepted: Orders

## 2023-05-21 ENCOUNTER — Other Ambulatory Visit (HOSPITAL_BASED_OUTPATIENT_CLINIC_OR_DEPARTMENT_OTHER): Payer: Self-pay

## 2023-06-16 ENCOUNTER — Other Ambulatory Visit: Payer: Self-pay | Admitting: Family Medicine

## 2023-06-17 ENCOUNTER — Other Ambulatory Visit: Payer: Self-pay

## 2023-06-17 MED ORDER — TIRZEPATIDE 12.5 MG/0.5ML ~~LOC~~ SOAJ: 12.5000 mg | SUBCUTANEOUS | 0 refills | Status: DC

## 2023-06-19 ENCOUNTER — Other Ambulatory Visit: Payer: Self-pay

## 2023-06-19 ENCOUNTER — Other Ambulatory Visit (HOSPITAL_BASED_OUTPATIENT_CLINIC_OR_DEPARTMENT_OTHER): Payer: Self-pay

## 2023-06-19 ENCOUNTER — Other Ambulatory Visit: Payer: Self-pay | Admitting: Family Medicine

## 2023-06-19 MED ORDER — TIRZEPATIDE 12.5 MG/0.5ML ~~LOC~~ SOAJ
12.5000 mg | SUBCUTANEOUS | 0 refills | Status: DC
  Filled 2023-06-19 – 2023-06-22 (×3): qty 2, 28d supply, fill #0

## 2023-06-23 ENCOUNTER — Other Ambulatory Visit (HOSPITAL_BASED_OUTPATIENT_CLINIC_OR_DEPARTMENT_OTHER): Payer: Self-pay

## 2023-06-24 ENCOUNTER — Other Ambulatory Visit (HOSPITAL_BASED_OUTPATIENT_CLINIC_OR_DEPARTMENT_OTHER): Payer: Self-pay | Admitting: Family Medicine

## 2023-06-24 ENCOUNTER — Other Ambulatory Visit (HOSPITAL_BASED_OUTPATIENT_CLINIC_OR_DEPARTMENT_OTHER): Payer: Self-pay

## 2023-06-24 DIAGNOSIS — Z1231 Encounter for screening mammogram for malignant neoplasm of breast: Secondary | ICD-10-CM

## 2023-06-25 ENCOUNTER — Other Ambulatory Visit (HOSPITAL_BASED_OUTPATIENT_CLINIC_OR_DEPARTMENT_OTHER): Payer: Self-pay

## 2023-06-30 ENCOUNTER — Encounter (HOSPITAL_BASED_OUTPATIENT_CLINIC_OR_DEPARTMENT_OTHER): Payer: Self-pay

## 2023-06-30 ENCOUNTER — Ambulatory Visit (HOSPITAL_BASED_OUTPATIENT_CLINIC_OR_DEPARTMENT_OTHER)
Admission: RE | Admit: 2023-06-30 | Discharge: 2023-06-30 | Disposition: A | Discharge status: Home / Self Care | Payer: 59 | Source: Ambulatory Visit | Attending: Family Medicine | Admitting: Family Medicine

## 2023-06-30 DIAGNOSIS — Z1231 Encounter for screening mammogram for malignant neoplasm of breast: Secondary | ICD-10-CM | POA: Insufficient documentation

## 2023-07-06 NOTE — Progress Notes (Unsigned)
[facility] [address] [place], [place] [address] [phone]  Date:  07/08/2023   Name:  Angela Carson   DOB:  [birth year]   MRN:  [id number]  PCP:  Pearline Cables, MD    Chief Complaint: follow up DM (Concerns/ questions: /Pt asks about CGM/Pap: due today/Flu shot today: pt will wait a little longer)   History of Present Illness:  Angela Carson is a 52 y.o. very pleasant female patient who presents with the following:  Pt seen today for recheck and pap  Last seen by myself in April  History of diabetes under reasonable control, dyslipidemia, vitamin D deficiency   Lab Results  Component Value Date   HGBA1C 6.8 (H) 02/26/2023   I started her on mounjaro at her last visit- she is on 12.5 now after titration-she notes this does decrease her appetite Also glipizide, metfomrin, crestor   Can update pap today  Colon cancer screening- cologuard 8/22  Wt Readings from Last 3 Encounters:  07/08/23 160 lb 12.8 oz (72.9 kg)  02/26/23 163 lb (73.9 kg)  08/11/22 167 lb 9.6 oz (76 kg)   Patient notes her mother lives in the [place] she is in her 72s and her health is starting to downhill.  This has been stressful for Angela Carson and she sentences difficulty sleeping.  She does use melatonin sometimes, wonders if there is anything else she could use on a as needed basis      Patient Active Problem List   Diagnosis Date Noted   Dyslipidemia 02/24/2023   Controlled type 2 diabetes mellitus without complication, without long-term current use of insulin ([facility]) 08/18/2019   Foot pain, bilateral 08/12/2017    Past Medical History:  Diagnosis Date   Multiple food allergies     No past surgical history on file.  Social History   Tobacco Use   Smoking status: Never   Smokeless tobacco: Never  Substance Use Topics   Alcohol use: No   Drug use: No    No family history on file.  Allergies  Allergen  Reactions   Metformin And Related     Diarrhea, cannot tolerate    Medication list has been reviewed and updated.  Current Outpatient Medications on File Prior to Visit  Medication Sig Dispense Refill   glipiZIDE (GLUCOTROL XL) 10 MG 24 hr tablet TAKE 1 TABLET (10 MG TOTAL) BY MOUTH DAILY WITH BREAKFAST. 30 tablet 5   metFORMIN (GLUCOPHAGE-XR) 500 MG 24 hr tablet TAKE 1 TABLET BY MOUTH EVERY DAY WITH BREAKFAST 30 tablet 5   nystatin-triamcinolone ointment (MYCOLOG) Apply 1 application topically 2 (two) times daily. 30 g 0   rosuvastatin (CRESTOR) 20 MG tablet TAKE 1 TABLET BY MOUTH EVERY DAY 90 tablet 1   tirzepatide (MOUNJARO) 12.5 MG/0.5ML Pen Inject 12.5 mg into the skin once a week. 2 mL 0   No current facility-administered medications on file prior to visit.    Review of Systems:  As per HPI- otherwise negative.   Physical Examination: Vitals:   07/08/23 1310  BP: 110/62  Pulse: 97  Resp: 18  Temp: 98.1 F (36.7 C)  SpO2: 95%   Vitals:   07/08/23 1310  Weight: 160 lb 12.8 oz (72.9 kg)   Body mass index is 31.4 kg/m. Ideal Body Weight:    GEN: no acute distress.  Mild obese, looks well  HEENT: Atraumatic, Normocephalic.  Ears and Nose: No  external deformity. CV: RRR, No M/G/R. No JVD. No thrill. No extra heart sounds. PULM: CTA B, no wheezes, crackles, rhonchi. No retractions. No resp. distress. No accessory muscle use. ABD: S, NT, ND EXTR: No c/c/e PSYCH: Normally interactive. Conversant.  Collected pap today  Pelvic: normal, no vaginal lesions or discharge. Uterus normal, no CMT, no adnexal tendereness or masses   Assessment and Plan: Screening for cervical cancer - Plan: Cytology - PAP  Controlled type 2 diabetes mellitus without complication, without long-term current use of insulin ([facility]) - Plan: Hemoglobin A1c, Comprehensive metabolic panel  Adjustment insomnia - Plan: traZODone (DESYREL) 50 MG tablet  Patient seen today for follow-up.  Pap smear  pending for cervical cancer screening Follow-up on diabetes, she is on Mounjaro 12.5 and has lost a few pounds.  Check A1c Prescribe trazodone to use as needed for insomnia, discussed how to use this medication.  She will let me know how it works for her Signed Abbe Amsterdam, MD  Received labs as below, message to patient  Results for orders placed or performed in visit on 07/08/23  Hemoglobin A1c  Result Value Ref Range   Hgb A1c MFr Bld 6.7 (H) 4.6 - 6.5 %  Comprehensive metabolic panel  Result Value Ref Range   Sodium 138 135 - 145 mEq/L   Potassium 4.5 3.5 - 5.1 mEq/L   Chloride 100 96 - 112 mEq/L   CO2 28 19 - 32 mEq/L   Glucose, Bld 115 (H) 70 - 99 mg/dL   BUN 11 6 - 23 mg/dL   Creatinine, Ser 6.04 0.40 - 1.20 mg/dL   Total Bilirubin 0.4 0.2 - 1.2 mg/dL   Alkaline Phosphatase 63 39 - 117 U/L   AST 16 0 - 37 U/L   ALT 14 0 - 35 U/L   Total Protein 7.6 6.0 - 8.3 g/dL   Albumin 4.4 3.5 - 5.2 g/dL   GFR 540.98 >11.91 mL/min   Calcium 10.1 8.4 - 10.5 mg/dL

## 2023-07-06 NOTE — Patient Instructions (Incomplete)
Good to see you today- I will be in touch with your pap Recommend flu and covid shots this fall   I sent in trazodone for you to try for sleep- take 1/2 or a whole before bed as needed. Let me know how this works for you I will also let you know how your A1c looks

## 2023-07-08 ENCOUNTER — Other Ambulatory Visit (HOSPITAL_COMMUNITY)
Admission: RE | Admit: 2023-07-08 | Discharge: 2023-07-08 | Disposition: A | Discharge status: Home / Self Care | Payer: 59 | Source: Ambulatory Visit | Attending: Family Medicine | Admitting: Family Medicine

## 2023-07-08 ENCOUNTER — Other Ambulatory Visit (HOSPITAL_BASED_OUTPATIENT_CLINIC_OR_DEPARTMENT_OTHER): Payer: Self-pay

## 2023-07-08 ENCOUNTER — Ambulatory Visit (INDEPENDENT_AMBULATORY_CARE_PROVIDER_SITE_OTHER): Payer: 59 | Admitting: Family Medicine

## 2023-07-08 ENCOUNTER — Encounter: Payer: Self-pay | Admitting: Family Medicine

## 2023-07-08 VITALS — BP 110/62 | HR 97 | Temp 98.1°F | Resp 18 | Wt 160.8 lb

## 2023-07-08 DIAGNOSIS — F5102 Adjustment insomnia: Secondary | ICD-10-CM | POA: Diagnosis not present

## 2023-07-08 DIAGNOSIS — Z124 Encounter for screening for malignant neoplasm of cervix: Secondary | ICD-10-CM | POA: Diagnosis not present

## 2023-07-08 DIAGNOSIS — E119 Type 2 diabetes mellitus without complications: Secondary | ICD-10-CM | POA: Diagnosis not present

## 2023-07-08 LAB — COMPREHENSIVE METABOLIC PANEL
ALT: 14 U/L (ref 0–35)
AST: 16 U/L (ref 0–37)
Albumin: 4.4 g/dL (ref 3.5–5.2)
Alkaline Phosphatase: 63 U/L (ref 39–117)
BUN: 11 mg/dL (ref 6–23)
CO2: 28 mEq/L (ref 19–32)
Calcium: 10.1 mg/dL (ref 8.4–10.5)
Chloride: 100 mEq/L (ref 96–112)
Creatinine, Ser: 0.67 mg/dL (ref 0.40–1.20)
GFR: 100.4 mL/min (ref >60.00)
Glucose, Bld: 115 mg/dL — ABNORMAL HIGH (ref 70–99)
Potassium: 4.5 mEq/L (ref 3.5–5.1)
Sodium: 138 mEq/L (ref 135–145)
Total Bilirubin: 0.4 mg/dL (ref 0.2–1.2)
Total Protein: 7.6 g/dL (ref 6.0–8.3)

## 2023-07-08 LAB — HEMOGLOBIN A1C: Hgb A1c MFr Bld: 6.7 % — ABNORMAL HIGH (ref 4.6–6.5)

## 2023-07-08 MED ORDER — TRAZODONE HCL 50 MG PO TABS
25.0000 mg | ORAL_TABLET | Freq: Every evening | ORAL | 3 refills | Status: DC | PRN
  Filled 2023-07-08: qty 30, 30d supply, fill #0

## 2023-07-16 ENCOUNTER — Encounter: Payer: Self-pay | Admitting: Family Medicine

## 2023-07-16 LAB — CYTOLOGY - PAP
Comment: NEGATIVE
Diagnosis: NEGATIVE
High risk HPV: NEGATIVE

## 2023-07-30 ENCOUNTER — Ambulatory Visit (INDEPENDENT_AMBULATORY_CARE_PROVIDER_SITE_OTHER): Payer: 59 | Admitting: Family Medicine

## 2023-07-30 ENCOUNTER — Other Ambulatory Visit (HOSPITAL_BASED_OUTPATIENT_CLINIC_OR_DEPARTMENT_OTHER): Payer: Self-pay

## 2023-07-30 VITALS — BP 122/80 | HR 100 | Temp 98.1°F | Resp 18 | Ht 61.0 in | Wt 160.0 lb

## 2023-07-30 DIAGNOSIS — Z8719 Personal history of other diseases of the digestive system: Secondary | ICD-10-CM

## 2023-07-30 DIAGNOSIS — G8929 Other chronic pain: Secondary | ICD-10-CM | POA: Diagnosis not present

## 2023-07-30 DIAGNOSIS — M25562 Pain in left knee: Secondary | ICD-10-CM

## 2023-07-30 DIAGNOSIS — E785 Hyperlipidemia, unspecified: Secondary | ICD-10-CM

## 2023-07-30 MED ORDER — CLOBETASOL PROPIONATE 0.05 % EX GEL
1.0000 | Freq: Three times a day (TID) | CUTANEOUS | 0 refills | Status: DC
  Filled 2023-07-30: qty 30, 30d supply, fill #0

## 2023-07-30 MED ORDER — NYSTATIN 100000 UNIT/ML MT SUSP
Freq: Four times a day (QID) | OROMUCOSAL | 0 refills | Status: DC | PRN
  Filled 2023-07-30: qty 120, 6d supply, fill #0

## 2023-07-30 MED ORDER — ROSUVASTATIN CALCIUM 20 MG PO TABS
20.0000 mg | ORAL_TABLET | Freq: Every day | ORAL | 3 refills | Status: DC
  Filled 2023-07-30: qty 30, 30d supply, fill #0

## 2023-07-30 NOTE — Patient Instructions (Addendum)
It was good to see you today, please let me know if your mouth ulcers are not improving.  Treat with clobetasol ointment 3 times a day as needed.  Try to dry the ulcer with a Q-tip or piece of cloth, then apply a small amount of the ointment.  You can apply Orabase over the top to help hold the ointment in place longer.  I also gave you a Magic mouthwash which you can swish and spit as needed for pain  Please let me know if this is not healing soon  I placed a referral to sports medicine here at the med center to look at your knee  Please come and see me soon- I will be glad to take a look at your back

## 2023-07-30 NOTE — Progress Notes (Signed)
Angela Carson [address] [place], [place] [address] [phone]  Date:  07/30/2023   Name:  Angela Carson   DOB:  [birth year]   MRN:  [id number]  PCP:  Angela Cables, Angela Carson    Chief Complaint: sores in mouth (Concerns/ questions: 1. Back and knee pain as well. 2. Rash behind the ears. )   History of Present Illness:  Angela Carson is a 52 y.o. very pleasant female patient who presents with the following:  Pt seen today with concern of mouth sores - history of DM, dyslipidemia She has multiple mouth sore for about 2 weeks She may get these from time to time- this is more severe than usual however Seems to be doing a bit better since yesterday- she tried a vinegar, water and salt gargle and this did help It hurts to eat  She believes the canker sores are giving her a HA   She notes left knee pain as well.  She did have a knee x-ray in May of last year which was normal.  Her knee has been chronically painful for quite some time.  She is not aware of any particular injury She also notes a "rash" behind her ear.  She has a small area of rash on her right eyelid   Pulse Readings from Last 3 Encounters:  07/30/23 100  07/08/23 97  02/26/23 88     Patient Active Problem List   Diagnosis Date Noted   Dyslipidemia 02/24/2023   Controlled type 2 diabetes mellitus without complication, without long-term current use of insulin (HCC) 08/18/2019   Foot pain, bilateral 08/12/2017    Past Medical History:  Diagnosis Date   Multiple food allergies     No past surgical history on file.  Social History   Tobacco Use   Smoking status: Never   Smokeless tobacco: Never  Substance Use Topics   Alcohol use: No   Drug use: No    No family history on file.  Allergies  Allergen Reactions   Metformin And Related     Diarrhea, cannot tolerate    Medication list has been reviewed and updated.  Current Outpatient  Medications on File Prior to Visit  Medication Sig Dispense Refill   glipiZIDE (GLUCOTROL XL) 10 MG 24 hr tablet TAKE 1 TABLET (10 MG TOTAL) BY MOUTH DAILY WITH BREAKFAST. 30 tablet 5   metFORMIN (GLUCOPHAGE-XR) 500 MG 24 hr tablet TAKE 1 TABLET BY MOUTH EVERY DAY WITH BREAKFAST 30 tablet 5   rosuvastatin (CRESTOR) 20 MG tablet TAKE 1 TABLET BY MOUTH EVERY DAY 90 tablet 1   tirzepatide (MOUNJARO) 12.5 MG/0.5ML Pen Inject 12.5 mg into the skin once a week. 2 mL 0   traZODone (DESYREL) 50 MG tablet Take 0.5-1 tablets (25-50 mg total) by mouth at bedtime as needed for sleep. 30 tablet 3   No current facility-administered medications on file prior to visit.    Review of Systems:  As per HPI- otherwise negative.   Physical Examination: Vitals:   07/30/23 1401  BP: 122/80  Pulse: 100  Resp: 18  Temp: 98.1 F (36.7 C)  SpO2: 97%   Vitals:   07/30/23 1401  Weight: 160 lb (72.6 kg)  Height: 5\' 1"  (1.549 m)   Body mass index is 30.23 kg/m. Ideal Body Weight: Weight in (lb) to have BMI = 25: 132  GEN: no acute distress.  Obese, otherwise looks well  HEENT: Atraumatic, Normocephalic.  Canker sores are visible on her tongue.  Patient also feels that there are similar sores farther back in her mouth but I am not able to see them.  Otherwise throat, nose, ears and eyes normal Ears and Nose: No external deformity. CV: RRR, No M/G/R. No JVD. No thrill. No extra heart sounds. PULM: CTA B, no wheezes, crackles, rhonchi. No retractions. No resp. distress. No accessory muscle use. ABD: S, NT, ND EXTR: No c/c/e PSYCH: Normally interactive. Conversant.  There is some cracking at the skin behind both ears, especially on the left consistent with eczema.  There is also a spot of eczema on the right upper lid  Assessment and Plan: Chronic pain of left knee - Plan: Ambulatory referral to Sports Medicine  Dyslipidemia - Plan: rosuvastatin (CRESTOR) 20 MG tablet  H/O oral aphthous ulcers - Plan:  lidocaine-diphenhydrAMINE-nystatin, clobetasol (TEMOVATE) 0.05 % GEL  Patient seen today for a couple of concerns.  Will treat her if this ulcers with Magic mouthwash and clobetasol steroid.  Patient notes she has another steroid preparation at home that she has used for eczema.  I advised her to be fine to use this behind her ears Refilled her Crestor  Referral to sports medicine to discuss knee pain   Signed Abbe Amsterdam, Angela Carson

## 2023-07-31 ENCOUNTER — Other Ambulatory Visit (HOSPITAL_BASED_OUTPATIENT_CLINIC_OR_DEPARTMENT_OTHER): Payer: Self-pay

## 2023-08-04 ENCOUNTER — Ambulatory Visit (INDEPENDENT_AMBULATORY_CARE_PROVIDER_SITE_OTHER): Payer: 59 | Admitting: Sports Medicine

## 2023-08-04 ENCOUNTER — Encounter: Payer: Self-pay | Admitting: Sports Medicine

## 2023-08-04 VITALS — BP 130/80 | Ht 61.0 in | Wt 160.0 lb

## 2023-08-04 DIAGNOSIS — M25562 Pain in left knee: Secondary | ICD-10-CM | POA: Diagnosis not present

## 2023-08-04 DIAGNOSIS — G8929 Other chronic pain: Secondary | ICD-10-CM

## 2023-08-04 NOTE — Progress Notes (Signed)
Subjective:    Patient ID: Angela Carson, female    DOB: [birth year], 52 y.o.   MRN: [id number]  HPI chief complaint: Left knee pain  Patient is a very pleasant 52 year old female that presents today with over 1 year of left knee pain.  She injured the knee in 2023 while cleaning.  She was doing quite a bit of standing and squatting when she felt a pop in the knee.  Immediate pain.  Some mild swelling as well.  After a few days she went to an urgent care where x-rays were done and are available for my review.  She was given meloxicam which has not been very helpful.  She takes it only as needed.  Her pain is deep within the knee and is present with twisting, turning, and squatting.  Her pain was initially intermittent but is now more constant.  It is causing her to limp which is causing her to have left hip and low back pain.  She denies problems with this knee prior to her injury.  No prior left knee surgery.  She feels like the knee is weak and wants to give way.  No locking.  Past medical history reviewed Medications reviewed Allergies reviewed   Review of Systems As above    Objective:   Physical Exam  Well-developed, well-nourished.  No acute distress  Left knee: Full range of motion.  No effusion.  She is tender to palpation along the medial joint line with a positive McMurray's.  Positive Thessaly's.  No tenderness along the lateral joint line.  Knee is stable to valgus and varus stressing.  Negative anterior drawer, negative posterior drawer.  Neurovascularly intact distally.  X-rays of the left knee show nothing acute and no significant degenerative changes      Assessment & Plan:   Left knee pain worrisome for medial meniscal tear  MRI of the left knee to rule out medial meniscal tear.  She will schedule a follow-up visit with me after the MRI so we can discuss those results and delineate a definitive treatment plan.  In the meantime, she will start daily quad and hip  isometric strengthening.  This note was dictated using Dragon naturally speaking software and may contain errors in syntax, spelling, or content which have not been identified prior to signing this note.

## 2023-08-12 ENCOUNTER — Ambulatory Visit (HOSPITAL_BASED_OUTPATIENT_CLINIC_OR_DEPARTMENT_OTHER)
Admission: RE | Admit: 2023-08-12 | Discharge: 2023-08-12 | Disposition: A | Discharge status: Home / Self Care | Payer: 59 | Source: Ambulatory Visit | Attending: Sports Medicine | Admitting: Sports Medicine

## 2023-08-12 DIAGNOSIS — G8929 Other chronic pain: Secondary | ICD-10-CM | POA: Insufficient documentation

## 2023-08-12 DIAGNOSIS — M25562 Pain in left knee: Secondary | ICD-10-CM | POA: Diagnosis present

## 2023-08-13 ENCOUNTER — Other Ambulatory Visit: Payer: Self-pay | Admitting: Family Medicine

## 2023-08-25 ENCOUNTER — Ambulatory Visit (INDEPENDENT_AMBULATORY_CARE_PROVIDER_SITE_OTHER): Payer: 59 | Admitting: Sports Medicine

## 2023-08-25 ENCOUNTER — Encounter: Payer: Self-pay | Admitting: Sports Medicine

## 2023-08-25 VITALS — BP 120/88 | Ht 61.0 in | Wt 160.0 lb

## 2023-08-25 DIAGNOSIS — S83207D Unspecified tear of unspecified meniscus, current injury, left knee, subsequent encounter: Secondary | ICD-10-CM

## 2023-08-25 NOTE — Progress Notes (Signed)
Patient ID: Angela Carson, female   DOB: [birth year], 52 y.o.   MRN: [id number]  Patient presents today to discuss MRI findings of the left knee.  MRI shows an oblique tear to the posterior horn medial meniscus extending to the inferior articular surface with an associated 6 x 23 mm parameniscal cyst.  She has had symptoms for over a year.  Symptoms began traumatically.  Therefore, I recommended a referral to Dr. Jerl Santos to discuss treatment options.  I will defer further workup and treatment to his discretion.  Follow-up with me as needed.

## 2023-08-31 ENCOUNTER — Ambulatory Visit (HOSPITAL_BASED_OUTPATIENT_CLINIC_OR_DEPARTMENT_OTHER): Payer: 59

## 2023-09-01 ENCOUNTER — Encounter (HOSPITAL_BASED_OUTPATIENT_CLINIC_OR_DEPARTMENT_OTHER): Payer: Self-pay

## 2023-09-01 ENCOUNTER — Ambulatory Visit (HOSPITAL_BASED_OUTPATIENT_CLINIC_OR_DEPARTMENT_OTHER)
Admission: RE | Admit: 2023-09-01 | Discharge: 2023-09-01 | Disposition: A | Discharge status: Home / Self Care | Payer: 59 | Source: Ambulatory Visit | Attending: Family Medicine | Admitting: Family Medicine

## 2023-09-01 DIAGNOSIS — Z1231 Encounter for screening mammogram for malignant neoplasm of breast: Secondary | ICD-10-CM | POA: Insufficient documentation

## 2023-09-03 ENCOUNTER — Encounter: Payer: Self-pay | Admitting: Family Medicine

## 2023-09-03 DIAGNOSIS — R928 Other abnormal and inconclusive findings on diagnostic imaging of breast: Secondary | ICD-10-CM

## 2023-09-12 ENCOUNTER — Other Ambulatory Visit: Payer: Self-pay | Admitting: Family Medicine

## 2023-09-12 ENCOUNTER — Ambulatory Visit
Admission: RE | Admit: 2023-09-12 | Discharge: 2023-09-12 | Disposition: A | Discharge status: Home / Self Care | Payer: 59 | Source: Ambulatory Visit | Attending: Family Medicine | Admitting: Family Medicine

## 2023-09-12 DIAGNOSIS — R928 Other abnormal and inconclusive findings on diagnostic imaging of breast: Secondary | ICD-10-CM

## 2023-09-12 DIAGNOSIS — N6310 Unspecified lump in the right breast, unspecified quadrant: Secondary | ICD-10-CM

## 2023-10-11 ENCOUNTER — Other Ambulatory Visit: Payer: Self-pay | Admitting: Family Medicine

## 2023-10-11 DIAGNOSIS — E119 Type 2 diabetes mellitus without complications: Secondary | ICD-10-CM

## 2023-10-11 DIAGNOSIS — E785 Hyperlipidemia, unspecified: Secondary | ICD-10-CM

## 2023-10-26 ENCOUNTER — Encounter: Payer: Self-pay | Admitting: Family Medicine

## 2023-10-26 NOTE — Telephone Encounter (Signed)
Will she need to come in for preop? She was seen not too long ago, last EKG was in April.

## 2023-12-10 ENCOUNTER — Other Ambulatory Visit: Payer: Self-pay | Admitting: Family Medicine

## 2023-12-10 ENCOUNTER — Encounter: Payer: Self-pay | Admitting: Family Medicine

## 2023-12-10 DIAGNOSIS — E785 Hyperlipidemia, unspecified: Secondary | ICD-10-CM

## 2023-12-10 DIAGNOSIS — E119 Type 2 diabetes mellitus without complications: Secondary | ICD-10-CM

## 2023-12-11 ENCOUNTER — Other Ambulatory Visit (HOSPITAL_BASED_OUTPATIENT_CLINIC_OR_DEPARTMENT_OTHER): Payer: Self-pay

## 2023-12-11 MED ORDER — ROSUVASTATIN CALCIUM 20 MG PO TABS: 20.0000 mg | ORAL_TABLET | Freq: Every day | ORAL | 0 refills | Status: DC

## 2023-12-11 MED ORDER — GLIPIZIDE ER 10 MG PO TB24: 10.0000 mg | ORAL_TABLET | Freq: Every day | ORAL | 0 refills | Status: DC

## 2023-12-11 MED ORDER — METFORMIN HCL ER 500 MG PO TB24: 500.0000 mg | ORAL_TABLET | Freq: Every day | ORAL | 0 refills | Status: DC

## 2023-12-11 MED ORDER — MOUNJARO 12.5 MG/0.5ML ~~LOC~~ SOAJ
12.5000 mg | SUBCUTANEOUS | 1 refills | Status: DC
  Filled 2023-12-11: qty 2, 28d supply, fill #0
  Filled 2024-01-11: qty 2, 28d supply, fill #1
  Filled 2024-02-08: qty 2, 28d supply, fill #2
  Filled 2024-03-05: qty 2, 28d supply, fill #3
  Filled 2024-04-05: qty 2, 28d supply, fill #4
  Filled 2024-05-01: qty 2, 28d supply, fill #5

## 2023-12-16 ENCOUNTER — Other Ambulatory Visit (HOSPITAL_BASED_OUTPATIENT_CLINIC_OR_DEPARTMENT_OTHER): Payer: Self-pay

## 2023-12-24 ENCOUNTER — Ambulatory Visit: Payer: 59 | Admitting: Physician Assistant

## 2023-12-24 ENCOUNTER — Encounter: Payer: Self-pay | Admitting: Physician Assistant

## 2023-12-24 VITALS — BP 120/75 | HR 101 | Temp 98.7°F | Resp 16 | Ht 61.0 in | Wt 158.2 lb

## 2023-12-24 DIAGNOSIS — B349 Viral infection, unspecified: Secondary | ICD-10-CM

## 2023-12-24 DIAGNOSIS — J069 Acute upper respiratory infection, unspecified: Secondary | ICD-10-CM | POA: Diagnosis not present

## 2023-12-24 LAB — POCT INFLUENZA A/B
Influenza A, POC: NEGATIVE
Influenza B, POC: NEGATIVE

## 2023-12-24 LAB — POC COVID19 BINAXNOW: SARS Coronavirus 2 Ag: NEGATIVE

## 2023-12-24 NOTE — Progress Notes (Signed)
Established patient visit   Patient: Angela Carson   DOB: 01/23/1971   53 y.o. Female  MRN: [id number] Visit Date: 12/24/2023  Today's healthcare provider: Alfredia Ferguson, PA-C   Chief Complaint  Patient presents with   Sore Throat    X1 day, Congestion, headache, denies fever  Sick contacts at work on Tuesday- she is unsure if they had flu or covid   Subjective     Pt reports sore throat, congestion, headache, x 1 day. denies fever. Mild cough, denies SOB, chest pain. She reports several sick contacts at work.  She is taking mucinex DM and tylenol otc.   Medications: Outpatient Medications Prior to Visit  Medication Sig   glipiZIDE (GLUCOTROL XL) 10 MG 24 hr tablet Take 1 tablet (10 mg total) by mouth daily with breakfast.   metFORMIN (GLUCOPHAGE-XR) 500 MG 24 hr tablet Take 1 tablet (500 mg total) by mouth daily with breakfast.   rosuvastatin (CRESTOR) 20 MG tablet Take 1 tablet (20 mg total) by mouth daily.   tirzepatide (MOUNJARO) 12.5 MG/0.5ML Pen Inject 12.5 mg into the skin once a week.   clobetasol (TEMOVATE) 0.05 % GEL Apply 1 Application topically in the morning, at noon, and at bedtime. Use as needed for mouth ulcer. (Patient not taking: Reported on 12/24/2023)   lidocaine-diphenhydrAMINE-nystatin-hydrocortisone Take 5 ml by mouth 4 (four) times daily as needed for mouth pain. Swish, hold in mouth and then spit out (Patient not taking: Reported on 12/24/2023)   traZODone (DESYREL) 50 MG tablet Take 0.5-1 tablets (25-50 mg total) by mouth at bedtime as needed for sleep. (Patient not taking: Reported on 12/24/2023)   No facility-administered medications prior to visit.    Review of Systems  Constitutional:  Positive for fatigue. Negative for fever.  HENT:  Positive for congestion and sore throat.   Respiratory:  Negative for cough and shortness of breath.   Cardiovascular:  Negative for chest pain and leg swelling.  Gastrointestinal:  Negative for abdominal pain.   Neurological:  Negative for dizziness and headaches.       Objective    BP 120/75   Pulse (!) 101   Temp 98.7 F (37.1 C) (Oral)   Resp 16   Ht 5\' 1"  (1.549 m)   Wt 158 lb 4 oz (71.8 kg)   SpO2 97%   BMI 29.90 kg/m    Physical Exam Constitutional:      General: She is awake.     Appearance: She is well-developed.  HENT:     Head: Normocephalic.     Mouth/Throat:     Pharynx: Posterior oropharyngeal erythema present. No oropharyngeal exudate.  Eyes:     Conjunctiva/sclera: Conjunctivae normal.  Cardiovascular:     Rate and Rhythm: Normal rate and regular rhythm.     Heart sounds: Normal heart sounds.  Pulmonary:     Effort: Pulmonary effort is normal.     Breath sounds: Normal breath sounds.  Skin:    General: Skin is warm.  Neurological:     Mental Status: She is alert and oriented to person, place, and time.  Psychiatric:        Attention and Perception: Attention normal.        Mood and Affect: Mood normal.        Speech: Speech normal.        Behavior: Behavior is cooperative.     Results for orders placed or performed in visit on 12/24/23  POCT Influenza  A/B  Result Value Ref Range   Influenza A, POC Negative Negative   Influenza B, POC Negative Negative  POC COVID-19  Result Value Ref Range   SARS Coronavirus 2 Ag Negative Negative    Assessment & Plan    Upper respiratory tract infection, unspecified type  Viral syndrome -     POCT Influenza A/B -     POC COVID-19 BinaxNow   Poc covid , flu negative  Cont otc meds and hydrate post op. I dont recommend taking anything else today.  Advise she contact the surgical coordinator to let them know of her symptoms.  Return if symptoms worsen or fail to improve.       Alfredia Ferguson, PA-C  [facility] at [facility] [phone] (phone) [phone] (fax)  [facility]

## 2024-01-19 ENCOUNTER — Encounter: Payer: Self-pay | Admitting: Family Medicine

## 2024-02-04 ENCOUNTER — Encounter: Payer: Self-pay | Admitting: Family Medicine

## 2024-02-04 ENCOUNTER — Ambulatory Visit (HOSPITAL_BASED_OUTPATIENT_CLINIC_OR_DEPARTMENT_OTHER)
Admission: RE | Admit: 2024-02-04 | Discharge: 2024-02-04 | Disposition: A | Discharge status: Home / Self Care | Source: Ambulatory Visit | Attending: Family Medicine | Admitting: Family Medicine

## 2024-02-04 ENCOUNTER — Ambulatory Visit (INDEPENDENT_AMBULATORY_CARE_PROVIDER_SITE_OTHER): Admitting: Family Medicine

## 2024-02-04 VITALS — BP 120/72 | HR 99 | Temp 98.2°F | Resp 18 | Ht 61.0 in | Wt 157.6 lb

## 2024-02-04 DIAGNOSIS — Z7984 Long term (current) use of oral hypoglycemic drugs: Secondary | ICD-10-CM

## 2024-02-04 DIAGNOSIS — E785 Hyperlipidemia, unspecified: Secondary | ICD-10-CM | POA: Diagnosis not present

## 2024-02-04 DIAGNOSIS — Z0000 Encounter for general adult medical examination without abnormal findings: Secondary | ICD-10-CM

## 2024-02-04 DIAGNOSIS — Z1329 Encounter for screening for other suspected endocrine disorder: Secondary | ICD-10-CM

## 2024-02-04 DIAGNOSIS — N912 Amenorrhea, unspecified: Secondary | ICD-10-CM | POA: Diagnosis not present

## 2024-02-04 DIAGNOSIS — R0781 Pleurodynia: Secondary | ICD-10-CM

## 2024-02-04 DIAGNOSIS — E559 Vitamin D deficiency, unspecified: Secondary | ICD-10-CM

## 2024-02-04 DIAGNOSIS — Z130 Encounter for screening for diseases of the blood and blood-forming organs and certain disorders involving the immune mechanism: Secondary | ICD-10-CM

## 2024-02-04 DIAGNOSIS — E119 Type 2 diabetes mellitus without complications: Secondary | ICD-10-CM | POA: Diagnosis not present

## 2024-02-04 NOTE — Progress Notes (Addendum)
 [facility] at [facility] [address] [place], [place] [address] [phone]  Date:  02/04/2024   Name:  Angela Carson   DOB:  May 18, 1971   MRN:  [id number]  PCP:  Pearline Cables, MD    Chief Complaint: Annual Exam (Concerns/ questions: pt would like to have a breast exam today/Flu shot today: none/A1C/Urine MA due)   History of Present Illness:  Angela Carson is a 53 y.o. very pleasant female patient who presents with the following:  Patient seen today for physical exam Most recent visit with myself was in September  History of diabetes, dyslipidemia, vitamin D deficiency  She just had her left knee scoped about 2 weeks ago; this is doing well.  She is seeing Dr Yisroel Ramming  No menses in about 2 years-she would like to do an [facility] level to make sure she is menopausal and does not need to worry about pregnancy  COVID booster-recommended Foot exam can be updated A1c can be updated Needs urine micro- update today  Flu shot- she will do next year as she missed this year  Eye exam up-to-date Cologuard due in August Mammogram-she was recalled in October, had a diagnostic in November overall reassuring.  Recommend 106-month ultrasound follow-up She has not done shingles vaccine as she tested nonimmune for varicella and was immunized against varicella in 2022  She also notes some discomfort in her left lateral ribs for a year- this is on her mind more as a friend of their just died from cancer  They are he does not hurt with exercise, but hurts if she presses on it or lies on the area.  She has not seen a change in the skin and is not aware of any injury   Lab Results  Component Value Date   HGBA1C 6.7 (H) 07/08/2023   Glipizide XL 10 Metformin Crestor Mounjaro 12.5  Patient Active Problem List   Diagnosis Date Noted   Dyslipidemia 02/24/2023   Controlled type 2 diabetes mellitus without complication, without long-term  current use of insulin (HCC) 08/18/2019   Foot pain, bilateral 08/12/2017    Past Medical History:  Diagnosis Date   Multiple food allergies     No past surgical history on file.  Social History   Tobacco Use   Smoking status: Never   Smokeless tobacco: Never  Substance Use Topics   Alcohol use: No   Drug use: No    No family history on file.  Allergies  Allergen Reactions   Metformin And Related     Diarrhea, cannot tolerate    Medication list has been reviewed and updated.  Current Outpatient Medications on File Prior to Visit  Medication Sig Dispense Refill   glipiZIDE (GLUCOTROL XL) 10 MG 24 hr tablet Take 1 tablet (10 mg total) by mouth daily with breakfast. 90 tablet 0   lidocaine-diphenhydrAMINE-nystatin-hydrocortisone Take 5 ml by mouth 4 (four) times daily as needed for mouth pain. Swish, hold in mouth and then spit out 120 mL 0   metFORMIN (GLUCOPHAGE-XR) 500 MG 24 hr tablet Take 1 tablet (500 mg total) by mouth daily with breakfast. 90 tablet 0   rosuvastatin (CRESTOR) 20 MG tablet Take 1 tablet (20 mg total) by mouth daily. 90 tablet 0   tirzepatide (MOUNJARO) 12.5 MG/0.5ML Pen Inject 12.5 mg into the skin once a week. 6 mL 1   No current facility-administered medications on file prior to visit.  Review of Systems:  As per HPI- otherwise negative.   Physical Examination: Vitals:   02/04/24 1443  BP: 120/72  Pulse: 99  Resp: 18  Temp: 98.2 F (36.8 C)  SpO2: 100%   Vitals:   02/04/24 1443  Weight: 157 lb 9.6 oz (71.5 kg)  Height: 5\' 1"  (1.549 m)   Body mass index is 29.78 kg/m. Ideal Body Weight: Weight in (lb) to have BMI = 25: 132  GEN: no acute distress.  Overweight, looks well HEENT: Atraumatic, Normocephalic.  Bilateral TM wnl, oropharynx normal.  PEERL,EOMI.   Ears and Nose: No external deformity. CV: RRR, No M/G/R. No JVD. No thrill. No extra heart sounds. PULM: CTA B, no wheezes, crackles, rhonchi. No retractions. No resp.  distress. No accessory muscle use. ABD: S, NT, ND, +BS. No rebound. No HSM. EXTR: No c/c/e PSYCH: Normally interactive. Conversant.  She notes tenderness to pressure over the left lateral ribs, no other physical exam findings are present.  The area that is bothering her is not breast, it is more posterior.  Assessment and Plan: Physical exam  Dyslipidemia - Plan: Lipid panel  Controlled type 2 diabetes mellitus without complication, without long-term current use of insulin (HCC) - Plan: Comprehensive metabolic panel with GFR, Hemoglobin A1c, Microalbumin / creatinine urine ratio  Thyroid disorder screening - Plan: TSH  Vitamin D deficiency - Plan: VITAMIN D 25 Hydroxy (Vit-D Deficiency, Fractures)  Screening for deficiency anemia - Plan: CBC  Amenorrhea - Plan: FSH  Rib pain on left side - Plan: DG Ribs Unilateral W/Chest Left   Physical exam today,-encouraged healthy diet and exercise routine Will plan further follow- up pending labs. Follow-up on diabetes control today Will obtain plain films of her left ribs due to concern of persistent pain for about 1 year  Signed Abbe Amsterdam, MD  Received x-ray report as below, message to patient  DG Ribs Unilateral W/Chest Left Result Date: 02/04/2024 CLINICAL DATA:  Chronic left rib pain for 1 year. EXAM: LEFT RIBS AND CHEST - 3+ VIEW COMPARISON:  July 13, 2015. FINDINGS: No fracture or other bone lesions are seen involving the ribs. There is no evidence of pneumothorax or pleural effusion. Both lungs are clear. Heart size and mediastinal contours are within normal limits. IMPRESSION: Negative. Electronically Signed   By: Lupita Raider M.D.   On: 02/04/2024 16:14   Received her labs 3/29, message to pt   Results for orders placed or performed in visit on 02/04/24  CBC   Collection Time: 02/04/24  3:12 PM  Result Value Ref Range   WBC 7.4 4.0 - 10.5 K/uL   RBC 4.48 3.87 - 5.11 Mil/uL   Platelets 314.0 150.0 - 400.0 K/uL    Hemoglobin 13.0 12.0 - 15.0 g/dL   HCT 16.1 09.6 - 04.5 %   MCV 88.3 78.0 - 100.0 fl   MCHC 32.9 30.0 - 36.0 g/dL   RDW 40.9 81.1 - 91.4 %  Comprehensive metabolic panel with GFR   Collection Time: 02/04/24  3:12 PM  Result Value Ref Range   Sodium 141 135 - 145 mEq/L   Potassium 4.5 3.5 - 5.1 mEq/L   Chloride 102 96 - 112 mEq/L   CO2 28 19 - 32 mEq/L   Glucose, Bld 92 70 - 99 mg/dL   BUN 10 6 - 23 mg/dL   Creatinine, Ser 7.82 0.40 - 1.20 mg/dL   Total Bilirubin 0.5 0.2 - 1.2 mg/dL   Alkaline Phosphatase 77 39 -  117 U/L   AST 17 0 - 37 U/L   ALT 21 0 - 35 U/L   Total Protein 7.9 6.0 - 8.3 g/dL   Albumin 4.8 3.5 - 5.2 g/dL   GFR 161.09 >60.45 mL/min   Calcium 10.3 8.4 - 10.5 mg/dL  Hemoglobin W0J   Collection Time: 02/04/24  3:12 PM  Result Value Ref Range   Hgb A1c MFr Bld 6.7 (H) 4.6 - 6.5 %  Lipid panel   Collection Time: 02/04/24  3:12 PM  Result Value Ref Range   Cholesterol 151 0 - 200 mg/dL   Triglycerides 811.9 0.0 - 149.0 mg/dL   HDL 14.78 >29.56 mg/dL   VLDL 21.3 0.0 - 08.6 mg/dL   LDL Cholesterol 51 0 - 99 mg/dL   Total CHOL/HDL Ratio 2    NonHDL 79.38   TSH   Collection Time: 02/04/24  3:12 PM  Result Value Ref Range   TSH 1.64 0.35 - 5.50 uIU/mL  VITAMIN D 25 Hydroxy (Vit-D Deficiency, Fractures)   Collection Time: 02/04/24  3:12 PM  Result Value Ref Range   VITD 44.70 30.00 - 100.00 ng/mL  [facility]   Collection Time: 02/04/24  3:12 PM  Result Value Ref Range   FSH 28.0 mIU/ML  Microalbumin / creatinine urine ratio   Collection Time: 02/04/24  3:27 PM  Result Value Ref Range   Microalb, Ur 2.7 (H) 0.0 - 1.9 mg/dL   Creatinine,U 57.8 mg/dL   Microalb Creat Ratio 122.1 (H) 0.0 - 30.0 mg/g

## 2024-02-04 NOTE — Patient Instructions (Addendum)
 It was great to see you again today, I will be in touch with your labs Cologuard due for update in August- let me know if you need me to order this for you  We will also get x-rays of your left ribs- can be done on the ground floor today

## 2024-02-05 LAB — COMPREHENSIVE METABOLIC PANEL WITH GFR
ALT: 21 U/L (ref 0–35)
AST: 17 U/L (ref 0–37)
Albumin: 4.8 g/dL (ref 3.5–5.2)
Alkaline Phosphatase: 77 U/L (ref 39–117)
BUN: 10 mg/dL (ref 6–23)
CO2: 28 meq/L (ref 19–32)
Calcium: 10.3 mg/dL (ref 8.4–10.5)
Chloride: 102 meq/L (ref 96–112)
Creatinine, Ser: 0.61 mg/dL (ref 0.40–1.20)
GFR: 102.28 mL/min (ref >60.00)
Glucose, Bld: 92 mg/dL (ref 70–99)
Potassium: 4.5 meq/L (ref 3.5–5.1)
Sodium: 141 meq/L (ref 135–145)
Total Bilirubin: 0.5 mg/dL (ref 0.2–1.2)
Total Protein: 7.9 g/dL (ref 6.0–8.3)

## 2024-02-05 LAB — CBC
HCT: 39.6 % (ref 36.0–46.0)
Hemoglobin: 13 g/dL (ref 12.0–15.0)
MCHC: 32.9 g/dL (ref 30.0–36.0)
MCV: 88.3 fl (ref 78.0–100.0)
Platelets: 314 10*3/uL (ref 150.0–400.0)
RBC: 4.48 Mil/uL (ref 3.87–5.11)
RDW: 14.1 % (ref 11.5–15.5)
WBC: 7.4 10*3/uL (ref 4.0–10.5)

## 2024-02-05 LAB — MICROALBUMIN / CREATININE URINE RATIO
Creatinine,U: 22.3 mg/dL
Microalb Creat Ratio: 122.1 mg/g — ABNORMAL HIGH (ref 0.0–30.0)
Microalb, Ur: 2.7 mg/dL — ABNORMAL HIGH (ref 0.0–1.9)

## 2024-02-05 LAB — LIPID PANEL
Cholesterol: 151 mg/dL (ref 0–200)
HDL: 71.6 mg/dL (ref >39.00)
LDL Cholesterol: 51 mg/dL (ref 0–99)
NonHDL: 79.38
Total CHOL/HDL Ratio: 2
Triglycerides: 141 mg/dL (ref 0.0–149.0)
VLDL: 28.2 mg/dL (ref 0.0–40.0)

## 2024-02-05 LAB — FOLLICLE STIMULATING HORMONE: FSH: 28 m[IU]/mL

## 2024-02-05 LAB — TSH: TSH: 1.64 u[IU]/mL (ref 0.35–5.50)

## 2024-02-05 LAB — VITAMIN D 25 HYDROXY (VIT D DEFICIENCY, FRACTURES): VITD: 44.7 ng/mL (ref 30.00–100.00)

## 2024-02-06 ENCOUNTER — Encounter: Payer: Self-pay | Admitting: Family Medicine

## 2024-02-06 LAB — HEMOGLOBIN A1C: Hgb A1c MFr Bld: 6.7 % — ABNORMAL HIGH (ref 4.6–6.5)

## 2024-02-10 ENCOUNTER — Other Ambulatory Visit (HOSPITAL_BASED_OUTPATIENT_CLINIC_OR_DEPARTMENT_OTHER): Payer: Self-pay

## 2024-02-10 MED ORDER — LOSARTAN POTASSIUM 25 MG PO TABS
12.5000 mg | ORAL_TABLET | Freq: Every day | ORAL | 3 refills | Status: DC
  Filled 2024-02-10: qty 15, 30d supply, fill #0
  Filled 2024-02-21 – 2024-02-24 (×3): qty 15, 30d supply, fill #1
  Filled ????-??-??: fill #1

## 2024-02-22 ENCOUNTER — Other Ambulatory Visit (HOSPITAL_BASED_OUTPATIENT_CLINIC_OR_DEPARTMENT_OTHER): Payer: Self-pay

## 2024-02-24 ENCOUNTER — Other Ambulatory Visit (HOSPITAL_BASED_OUTPATIENT_CLINIC_OR_DEPARTMENT_OTHER): Payer: Self-pay

## 2024-02-25 ENCOUNTER — Other Ambulatory Visit (HOSPITAL_BASED_OUTPATIENT_CLINIC_OR_DEPARTMENT_OTHER): Payer: Self-pay

## 2024-03-01 ENCOUNTER — Other Ambulatory Visit (HOSPITAL_BASED_OUTPATIENT_CLINIC_OR_DEPARTMENT_OTHER): Payer: Self-pay

## 2024-03-01 MED ORDER — LOSARTAN POTASSIUM 25 MG PO TABS
12.5000 mg | ORAL_TABLET | Freq: Every day | ORAL | 3 refills | Status: DC
  Filled 2024-03-01: qty 45, 90d supply, fill #0
  Filled 2024-03-05: qty 15, 30d supply, fill #0
  Filled 2024-04-05: qty 15, 30d supply, fill #1
  Filled 2024-05-01: qty 15, 30d supply, fill #2
  Filled 2024-06-02: qty 15, 30d supply, fill #3
  Filled 2024-07-02: qty 15, 30d supply, fill #4
  Filled 2024-08-09: qty 15, 30d supply, fill #5
  Filled 2024-09-06: qty 15, 30d supply, fill #6

## 2024-03-01 NOTE — Addendum Note (Signed)
 Addended by: Gates Kasal C on: 03/01/2024 12:21 PM   Modules accepted: Orders

## 2024-03-05 ENCOUNTER — Other Ambulatory Visit: Payer: Self-pay

## 2024-03-06 ENCOUNTER — Other Ambulatory Visit: Payer: Self-pay

## 2024-03-18 ENCOUNTER — Ambulatory Visit
Admission: RE | Admit: 2024-03-18 | Discharge: 2024-03-18 | Disposition: A | Discharge status: Home / Self Care | Payer: 59 | Source: Ambulatory Visit | Attending: Family Medicine | Admitting: Family Medicine

## 2024-03-18 DIAGNOSIS — N6310 Unspecified lump in the right breast, unspecified quadrant: Secondary | ICD-10-CM

## 2024-03-21 ENCOUNTER — Other Ambulatory Visit: Payer: Self-pay | Admitting: Family Medicine

## 2024-03-21 DIAGNOSIS — N6310 Unspecified lump in the right breast, unspecified quadrant: Secondary | ICD-10-CM

## 2024-04-08 ENCOUNTER — Other Ambulatory Visit: Payer: Self-pay | Admitting: Family Medicine

## 2024-04-08 DIAGNOSIS — E785 Hyperlipidemia, unspecified: Secondary | ICD-10-CM

## 2024-04-08 DIAGNOSIS — E119 Type 2 diabetes mellitus without complications: Secondary | ICD-10-CM

## 2024-06-03 ENCOUNTER — Encounter: Payer: Self-pay | Admitting: Family Medicine

## 2024-06-03 ENCOUNTER — Other Ambulatory Visit (HOSPITAL_BASED_OUTPATIENT_CLINIC_OR_DEPARTMENT_OTHER): Payer: Self-pay

## 2024-06-03 MED ORDER — NYSTATIN-TRIAMCINOLONE 100000-0.1 UNIT/GM-% EX OINT
1.0000 | TOPICAL_OINTMENT | Freq: Two times a day (BID) | CUTANEOUS | 1 refills | Status: AC
  Filled 2024-06-03: qty 30, 15d supply, fill #0
  Filled 2024-11-06: qty 30, 15d supply, fill #1

## 2024-06-12 ENCOUNTER — Encounter: Payer: Self-pay | Admitting: Family Medicine

## 2024-06-12 DIAGNOSIS — Z1211 Encounter for screening for malignant neoplasm of colon: Secondary | ICD-10-CM

## 2024-06-17 ENCOUNTER — Other Ambulatory Visit (HOSPITAL_BASED_OUTPATIENT_CLINIC_OR_DEPARTMENT_OTHER): Payer: Self-pay

## 2024-06-17 ENCOUNTER — Other Ambulatory Visit: Payer: Self-pay | Admitting: Family Medicine

## 2024-06-17 DIAGNOSIS — E119 Type 2 diabetes mellitus without complications: Secondary | ICD-10-CM

## 2024-06-17 MED ORDER — MOUNJARO 12.5 MG/0.5ML ~~LOC~~ SOAJ
12.5000 mg | SUBCUTANEOUS | 0 refills | Status: DC
  Filled 2024-06-17: qty 2, 28d supply, fill #0
  Filled 2024-07-02 – 2024-07-08 (×3): qty 2, 28d supply, fill #1
  Filled 2024-08-09: qty 2, 28d supply, fill #2

## 2024-07-04 ENCOUNTER — Other Ambulatory Visit: Payer: Self-pay

## 2024-07-04 ENCOUNTER — Other Ambulatory Visit (HOSPITAL_BASED_OUTPATIENT_CLINIC_OR_DEPARTMENT_OTHER): Payer: Self-pay

## 2024-07-04 ENCOUNTER — Encounter: Payer: Self-pay | Admitting: Family Medicine

## 2024-07-04 LAB — COLOGUARD: COLOGUARD: NEGATIVE

## 2024-09-06 ENCOUNTER — Other Ambulatory Visit: Payer: Self-pay

## 2024-09-06 ENCOUNTER — Other Ambulatory Visit: Payer: Self-pay | Admitting: Family Medicine

## 2024-09-06 ENCOUNTER — Other Ambulatory Visit (HOSPITAL_BASED_OUTPATIENT_CLINIC_OR_DEPARTMENT_OTHER): Payer: Self-pay

## 2024-09-06 DIAGNOSIS — E119 Type 2 diabetes mellitus without complications: Secondary | ICD-10-CM

## 2024-09-06 MED ORDER — MOUNJARO 12.5 MG/0.5ML ~~LOC~~ SOAJ
12.5000 mg | SUBCUTANEOUS | 0 refills | Status: DC
Start: 2024-09-06 — End: 2024-09-21
  Filled 2024-09-06: qty 2, 28d supply, fill #0

## 2024-09-18 NOTE — Progress Notes (Addendum)
  [facility] at [facility] [address], [address] [place], [place] [address] [phone]  Date:  09/21/2024   Name:  Angela Carson   DOB:  05-22-1971   MRN:  [id number]  PCP:  Watt Harlene BROCKS, MD    Chief Complaint: No chief complaint on file.   History of Present Illness:  Angela Carson is a 53 y.o. very pleasant female patient who presents with the following:  Patient seen today for periodic follow-up I saw her most recently in March- History of diabetes, dyslipidemia, vitamin D  deficiency -Eye exam - Flu shot - COVID booster Can update labs and A1c- -mammogram completed this week - Smear completed last year, normal - Cologuard this past August, negative Does not need Shingrix  Losartan  Metformin  Crestor  Mounjaro  12.5  Lab Results  Component Value Date   HGBA1C 6.7 (H) 02/04/2024     Discussed the use of AI scribe software for clinical note transcription with the patient, who gave verbal consent to proceed.  History of Present Illness      Patient Active Problem List   Diagnosis Date Noted   Dyslipidemia 02/24/2023   Controlled type 2 diabetes mellitus without complication, without long-term current use of insulin (HCC) 08/18/2019   Foot pain, bilateral 08/12/2017    Past Medical History:  Diagnosis Date   Multiple food allergies     No past surgical history on file.  Social History   Tobacco Use   Smoking status: Never   Smokeless tobacco: Never  Substance Use Topics   Alcohol use: No   Drug use: No    No family history on file.  Allergies  Allergen Reactions   Metformin  And Related     Diarrhea, cannot tolerate    Medication list has been reviewed and updated.  Current Outpatient Medications on File Prior to Visit  Medication Sig Dispense Refill   lidocaine -diphenhydrAMINE-nystatin -hydrocortisone Take 5 ml by mouth 4 (four) times daily as needed for mouth pain. Swish, hold in mouth  and then spit out 120 mL 0   losartan  (COZAAR ) 25 MG tablet Take 0.5 tablets (12.5 mg total) by mouth daily. 45 tablet 3   metFORMIN  (GLUCOPHAGE -XR) 500 MG 24 hr tablet Take 1 tablet (500 mg total) by mouth daily with breakfast. 90 tablet 1   nystatin -triamcinolone  ointment (MYCOLOG) Apply 1 Application topically 2 (two) times daily. 30 g 1   rosuvastatin  (CRESTOR ) 20 MG tablet Take 1 tablet (20 mg total) by mouth daily. 90 tablet 1   tirzepatide  (MOUNJARO ) 12.5 MG/0.5ML Pen Inject 12.5 mg into the skin once a week. Needs appt 2 mL 0   No current facility-administered medications on file prior to visit.    Review of Systems:  .aspesr   Physical Examination: There were no vitals filed for this visit. There were no vitals filed for this visit. There is no height or weight on file to calculate BMI. Ideal Body Weight:    GEN: no acute distress. HEENT: Atraumatic, Normocephalic.  Ears and Nose: No external deformity. CV: RRR, No M/G/R. No JVD. No thrill. No extra heart sounds. PULM: CTA B, no wheezes, crackles, rhonchi. No retractions. No resp. distress. No accessory muscle use. ABD: S, NT, ND, +BS. No rebound. No HSM. EXTR: No c/c/e PSYCH: Normally interactive. Conversant.    Assessment and Plan: No diagnosis found.  Assessment & Plan   Signed Harlene Watt, MD

## 2024-09-18 NOTE — Patient Instructions (Incomplete)
 It was great to see you today, I will be in touch with your labs

## 2024-09-19 ENCOUNTER — Ambulatory Visit
Admission: RE | Admit: 2024-09-19 | Discharge: 2024-09-19 | Disposition: A | Source: Ambulatory Visit | Attending: Family Medicine | Admitting: Family Medicine

## 2024-09-19 ENCOUNTER — Ambulatory Visit
Admission: RE | Admit: 2024-09-19 | Discharge: 2024-09-19 | Disposition: A | Discharge status: Home / Self Care | Source: Ambulatory Visit | Attending: Family Medicine | Admitting: Family Medicine

## 2024-09-19 DIAGNOSIS — N6310 Unspecified lump in the right breast, unspecified quadrant: Secondary | ICD-10-CM

## 2024-09-21 ENCOUNTER — Ambulatory Visit: Admitting: Family Medicine

## 2024-09-21 ENCOUNTER — Other Ambulatory Visit (HOSPITAL_BASED_OUTPATIENT_CLINIC_OR_DEPARTMENT_OTHER): Payer: Self-pay

## 2024-09-21 ENCOUNTER — Encounter: Payer: Self-pay | Admitting: Family Medicine

## 2024-09-21 VITALS — BP 134/76 | HR 93 | Ht 61.0 in | Wt 157.4 lb

## 2024-09-21 DIAGNOSIS — E119 Type 2 diabetes mellitus without complications: Secondary | ICD-10-CM | POA: Diagnosis not present

## 2024-09-21 DIAGNOSIS — E785 Hyperlipidemia, unspecified: Secondary | ICD-10-CM | POA: Diagnosis not present

## 2024-09-21 DIAGNOSIS — N950 Postmenopausal bleeding: Secondary | ICD-10-CM

## 2024-09-21 DIAGNOSIS — Z130 Encounter for screening for diseases of the blood and blood-forming organs and certain disorders involving the immune mechanism: Secondary | ICD-10-CM

## 2024-09-21 DIAGNOSIS — I10 Essential (primary) hypertension: Secondary | ICD-10-CM

## 2024-09-21 DIAGNOSIS — D239 Other benign neoplasm of skin, unspecified: Secondary | ICD-10-CM

## 2024-09-21 MED ORDER — LOSARTAN POTASSIUM 25 MG PO TABS
12.5000 mg | ORAL_TABLET | Freq: Every day | ORAL | 3 refills | Status: AC
  Filled 2024-09-21: qty 45, 90d supply, fill #0
  Filled 2024-10-04: qty 15, 30d supply, fill #0
  Filled 2024-11-06: qty 15, 30d supply, fill #1
  Filled 2024-12-02: qty 15, 30d supply, fill #2

## 2024-09-21 MED ORDER — MOUNJARO 12.5 MG/0.5ML ~~LOC~~ SOAJ
12.5000 mg | SUBCUTANEOUS | 3 refills | Status: AC
  Filled 2024-09-21: qty 6, 84d supply, fill #0
  Filled 2024-10-04: qty 2, 28d supply, fill #0
  Filled 2024-10-30: qty 2, 28d supply, fill #1
  Filled 2024-11-30: qty 2, 28d supply, fill #2

## 2024-09-22 ENCOUNTER — Encounter: Payer: Self-pay | Admitting: Family Medicine

## 2024-09-22 LAB — CBC
HCT: 35.6 % — ABNORMAL LOW (ref 36.0–46.0)
Hemoglobin: 12 g/dL (ref 12.0–15.0)
MCHC: 33.6 g/dL (ref 30.0–36.0)
MCV: 87.2 fl (ref 78.0–100.0)
Platelets: 288 K/uL (ref 150.0–400.0)
RBC: 4.09 Mil/uL (ref 3.87–5.11)
RDW: 13.7 % (ref 11.5–15.5)
WBC: 7.8 K/uL (ref 4.0–10.5)

## 2024-09-22 LAB — LIPID PANEL
Cholesterol: 132 mg/dL (ref 0–200)
HDL: 59.4 mg/dL (ref >39.00)
LDL Cholesterol: 44 mg/dL (ref 0–99)
NonHDL: 72.14
Total CHOL/HDL Ratio: 2
Triglycerides: 143 mg/dL (ref 0.0–149.0)
VLDL: 28.6 mg/dL (ref 0.0–40.0)

## 2024-09-22 LAB — COMPREHENSIVE METABOLIC PANEL WITH GFR
ALT: 13 U/L (ref 0–35)
AST: 13 U/L (ref 0–37)
Albumin: 4.5 g/dL (ref 3.5–5.2)
Alkaline Phosphatase: 62 U/L (ref 39–117)
BUN: 9 mg/dL (ref 6–23)
CO2: 26 meq/L (ref 19–32)
Calcium: 9.4 mg/dL (ref 8.4–10.5)
Chloride: 104 meq/L (ref 96–112)
Creatinine, Ser: 0.64 mg/dL (ref 0.40–1.20)
GFR: 100.66 mL/min (ref >60.00)
Glucose, Bld: 134 mg/dL — ABNORMAL HIGH (ref 70–99)
Potassium: 4.1 meq/L (ref 3.5–5.1)
Sodium: 138 meq/L (ref 135–145)
Total Bilirubin: 0.5 mg/dL (ref 0.2–1.2)
Total Protein: 7.2 g/dL (ref 6.0–8.3)

## 2024-09-22 LAB — HEMOGLOBIN A1C: Hgb A1c MFr Bld: 6.8 % — ABNORMAL HIGH (ref 4.6–6.5)

## 2024-10-05 ENCOUNTER — Other Ambulatory Visit (HOSPITAL_BASED_OUTPATIENT_CLINIC_OR_DEPARTMENT_OTHER): Payer: Self-pay

## 2024-10-08 ENCOUNTER — Other Ambulatory Visit: Payer: Self-pay | Admitting: Family Medicine

## 2024-10-08 DIAGNOSIS — E785 Hyperlipidemia, unspecified: Secondary | ICD-10-CM

## 2024-10-08 DIAGNOSIS — E119 Type 2 diabetes mellitus without complications: Secondary | ICD-10-CM

## 2024-10-10 ENCOUNTER — Ambulatory Visit: Admitting: Obstetrics and Gynecology

## 2024-10-10 ENCOUNTER — Encounter: Payer: Self-pay | Admitting: Obstetrics and Gynecology

## 2024-10-10 VITALS — BP 110/62 | HR 89 | Wt 153.1 lb

## 2024-10-10 DIAGNOSIS — N950 Postmenopausal bleeding: Secondary | ICD-10-CM

## 2024-10-10 NOTE — Progress Notes (Signed)
 NEW GYNECOLOGY VISIT Chief Complaint  Patient presents with   postmenopausal bleeding     Subjective:  Angela Carson is a 53 y.o. [id number] who presents for postmenopausal bleeding.  No period x 2 years then had bleeding x 4 days in October (24-27). Describes it as a normal period.  PCP note reviewed.   Last pap:  Lab Results  Component Value Date   DIAGPAP  07/08/2023    - Negative for intraepithelial lesion or malignancy (NILM)   HPV NOT DETECTED 09/23/2018   HPVHIGH Negative 07/08/2023   History of abnormal pap: No    OB History     Gravida  2   Para  2   Term  2   Preterm      AB      Living  2      SAB      IAB      Ectopic      Multiple      Live Births  2           Past Medical History:  Diagnosis Date   Diabetes (HCC)    Multiple food allergies     No past surgical history on file.  Social History   Socioeconomic History   Marital status: Married    Spouse name: Not on file   Number of children: Not on file   Years of education: Not on file   Highest education level: Bachelor's degree (e.g., BA, AB, BS)  Occupational History   Not on file  Tobacco Use   Smoking status: Never   Smokeless tobacco: Never  Substance and Sexual Activity   Alcohol use: No   Drug use: No   Sexual activity: Yes    Birth control/protection: None  Other Topics Concern   Not on file  Social History Narrative   Not on file   Social Drivers of Health   Financial Resource Strain: Low Risk  (09/20/2024)   Overall Financial Resource Strain (CARDIA)    Difficulty of Paying Living Expenses: Not very hard  Food Insecurity: No Food Insecurity (09/20/2024)   Hunger Vital Sign    Worried About Running Out of Food in the Last Year: Never true    Ran Out of Food in the Last Year: Never true  Transportation Needs: No Transportation Needs (09/20/2024)   PRAPARE - Administrator, Civil Service (Medical): No    Lack of Transportation  (Non-Medical): No  Physical Activity: Insufficiently Active (09/20/2024)   Exercise Vital Sign    Days of Exercise per Week: 2 days    Minutes of Exercise per Session: 40 min  Stress: No Stress Concern Present (09/20/2024)   [employer] of Occupational Health - Occupational Stress Questionnaire    Feeling of Stress: Only a little  Social Connections: Unknown (09/20/2024)   Social Connection and Isolation Panel    Frequency of Communication with Friends and Family: Twice a week    Frequency of Social Gatherings with Friends and Family: Patient declined    Attends Religious Services: More than 4 times per year    Active Member of [employer] or Organizations: Yes    Attends Banker Meetings: 1 to 4 times per year    Marital Status: Married    No family history on file.  Current Outpatient Medications on File Prior to Visit  Medication Sig Dispense Refill   losartan  (COZAAR ) 25 MG tablet Take 0.5 tablets (12.5 mg total) by mouth  daily. 45 tablet 3   metFORMIN  (GLUCOPHAGE -XR) 500 MG 24 hr tablet TAKE 1 TABLET BY MOUTH EVERY DAY WITH BREAKFAST 30 tablet 5   nystatin -triamcinolone  ointment (MYCOLOG) Apply 1 Application topically 2 (two) times daily. 30 g 1   rosuvastatin  (CRESTOR ) 20 MG tablet TAKE 1 TABLET BY MOUTH EVERY DAY 30 tablet 5   tirzepatide  (MOUNJARO ) 12.5 MG/0.5ML Pen Inject 12.5 mg into the skin once a week. 6 mL 3   lidocaine -diphenhydrAMINE-nystatin -hydrocortisone Take 5 ml by mouth 4 (four) times daily as needed for mouth pain. Swish, hold in mouth and then spit out 120 mL 0   No current facility-administered medications on file prior to visit.    Allergies  Allergen Reactions   Metformin  And Related     Diarrhea, cannot tolerate     Objective:   Vitals:   10/10/24 1507  BP: 110/62  Pulse: 89  Weight: 153 lb 1.9 oz (69.5 kg)   Physical Examination:   General appearance - well appearing, and in no distress  Mental status - alert, oriented to  person, place, and time  Psych:  normal mood and affect  Abdomen - soft, nontender, nondistended, no masses or organomegaly  Pelvic -  VULVA: normal appearing vulva with no masses, tenderness or lesions   VAGINA: normal appearing vagina with normal color and discharge, no lesions   CERVIX: normal appearing cervix without discharge or lesions, no CMT  UTERUS: uterus is felt to be normal size, shape, consistency and nontender   ADNEXA: No adnexal masses or tenderness noted.  Extremities:  No swelling or varicosities noted  Chaperone present for exam  Assessment and Plan:  1. Postmenopausal bleeding (Primary) Reviewed postmenopausal bleeding in detail and etiologies can include atrophy, polyps, precancer or cancer. Recommend further evaluation with either biopsy or ultrasound. Reviewed if ultrasound abnormal, may require biopsy or procedure under anesthesia. She chooses pelvic ultrasound. Will contact patient with results and next steps. - US  PELVIC COMPLETE WITH TRANSVAGINAL; Future   Future Appointments  Date Time Provider Department Center  05/02/2025  4:00 PM Sandridge, Erminio POUR, PA-C [facility] None    Rollo ONEIDA Bring, MD, FACOG Obstetrician & Gynecologist, [facility] for [facility], [facility]

## 2024-10-13 ENCOUNTER — Ambulatory Visit (HOSPITAL_BASED_OUTPATIENT_CLINIC_OR_DEPARTMENT_OTHER)
Admission: RE | Admit: 2024-10-13 | Discharge: 2024-10-13 | Disposition: A | Source: Ambulatory Visit | Attending: Obstetrics and Gynecology

## 2024-10-13 ENCOUNTER — Ambulatory Visit (HOSPITAL_BASED_OUTPATIENT_CLINIC_OR_DEPARTMENT_OTHER)

## 2024-10-13 DIAGNOSIS — N950 Postmenopausal bleeding: Secondary | ICD-10-CM | POA: Diagnosis present

## 2024-10-18 ENCOUNTER — Telehealth: Payer: Self-pay

## 2024-10-18 NOTE — Telephone Encounter (Signed)
 Results have not been read yet.

## 2024-10-18 NOTE — Telephone Encounter (Signed)
 Copied from CRM #[id number]. Topic: Clinical - Lab/Test Results >> Oct 18, 2024 10:48 AM Nessti S wrote: Reason for CRM: pt called in about ultrasound results. Call back number [phone] (M)

## 2024-10-20 ENCOUNTER — Ambulatory Visit: Payer: Self-pay | Admitting: Obstetrics and Gynecology

## 2024-10-20 DIAGNOSIS — N83202 Unspecified ovarian cyst, left side: Secondary | ICD-10-CM

## 2024-11-07 ENCOUNTER — Other Ambulatory Visit (HOSPITAL_BASED_OUTPATIENT_CLINIC_OR_DEPARTMENT_OTHER): Payer: Self-pay

## 2024-11-07 ENCOUNTER — Other Ambulatory Visit: Payer: Self-pay

## 2024-12-02 ENCOUNTER — Other Ambulatory Visit (HOSPITAL_BASED_OUTPATIENT_CLINIC_OR_DEPARTMENT_OTHER): Payer: Self-pay

## 2025-01-19 ENCOUNTER — Other Ambulatory Visit

## 2025-01-19 ENCOUNTER — Other Ambulatory Visit (HOSPITAL_BASED_OUTPATIENT_CLINIC_OR_DEPARTMENT_OTHER)

## 2025-05-02 ENCOUNTER — Ambulatory Visit: Admitting: Physician Assistant
